# Patient Record
Sex: Male | Born: 1965 | Race: White | Hispanic: No | Marital: Married | Smoking: Former smoker
Health system: Southern US, Community
[De-identification: ages and names within clinical notes are randomized; demographics above are authoritative.]

## PROBLEM LIST (undated history)

## (undated) DIAGNOSIS — I10 Essential (primary) hypertension: Secondary | ICD-10-CM

## (undated) DIAGNOSIS — M2550 Pain in unspecified joint: Secondary | ICD-10-CM

## (undated) DIAGNOSIS — E119 Type 2 diabetes mellitus without complications: Secondary | ICD-10-CM

## (undated) DIAGNOSIS — K5792 Diverticulitis of intestine, part unspecified, without perforation or abscess without bleeding: Secondary | ICD-10-CM

## (undated) DIAGNOSIS — E785 Hyperlipidemia, unspecified: Secondary | ICD-10-CM

## (undated) DIAGNOSIS — G4730 Sleep apnea, unspecified: Secondary | ICD-10-CM

## (undated) HISTORY — DX: Type 2 diabetes mellitus without complications: E11.9

## (undated) HISTORY — DX: Sleep apnea, unspecified: G47.30

## (undated) HISTORY — DX: Essential (primary) hypertension: I10

## (undated) HISTORY — DX: Pain in unspecified joint: M25.50

## (undated) HISTORY — DX: Diverticulitis of intestine, part unspecified, without perforation or abscess without bleeding: K57.92

## (undated) HISTORY — DX: Hyperlipidemia, unspecified: E78.5

---

## 2001-02-09 ENCOUNTER — Emergency Department (HOSPITAL_COMMUNITY): Admission: EM | Admit: 2001-02-09 | Discharge: 2001-02-09 | Payer: Self-pay | Admitting: Emergency Medicine

## 2004-03-07 ENCOUNTER — Other Ambulatory Visit: Payer: Self-pay

## 2005-08-19 ENCOUNTER — Emergency Department: Payer: Self-pay | Admitting: Emergency Medicine

## 2006-06-09 ENCOUNTER — Emergency Department: Payer: Self-pay | Admitting: Emergency Medicine

## 2006-06-09 ENCOUNTER — Other Ambulatory Visit: Payer: Self-pay

## 2007-01-27 ENCOUNTER — Other Ambulatory Visit: Payer: Self-pay

## 2007-01-30 ENCOUNTER — Ambulatory Visit: Payer: Self-pay | Admitting: General Surgery

## 2007-01-30 HISTORY — PX: HERNIA REPAIR: SHX51

## 2007-03-03 ENCOUNTER — Emergency Department: Payer: Self-pay | Admitting: Emergency Medicine

## 2008-10-05 ENCOUNTER — Ambulatory Visit: Payer: Self-pay | Admitting: Internal Medicine

## 2009-07-25 ENCOUNTER — Ambulatory Visit: Payer: Self-pay | Admitting: Internal Medicine

## 2011-09-28 ENCOUNTER — Emergency Department: Payer: Self-pay | Admitting: Emergency Medicine

## 2014-05-02 ENCOUNTER — Encounter: Payer: Self-pay | Admitting: *Deleted

## 2014-05-25 ENCOUNTER — Ambulatory Visit: Payer: Self-pay | Admitting: General Surgery

## 2014-06-06 ENCOUNTER — Encounter: Payer: Self-pay | Admitting: General Surgery

## 2014-06-08 ENCOUNTER — Ambulatory Visit (INDEPENDENT_AMBULATORY_CARE_PROVIDER_SITE_OTHER): Payer: 59 | Admitting: General Surgery

## 2014-06-08 ENCOUNTER — Encounter: Payer: Self-pay | Admitting: General Surgery

## 2014-06-08 VITALS — BP 120/78 | HR 80 | Resp 16 | Ht 70.0 in | Wt 314.0 lb

## 2014-06-08 DIAGNOSIS — K429 Umbilical hernia without obstruction or gangrene: Secondary | ICD-10-CM

## 2014-06-08 NOTE — Progress Notes (Signed)
Patient ID: Edward Gutierrez, male   DOB: July 17, 1966, 48 y.o.   MRN: [id number]  Chief Complaint  Patient presents with  . Other    Evaluation of umbilical hernia    HPI Edward Gutierrez is a 48 y.o. male.  Here today for evaluation of an umbilical hernia.Patient states he noticed this about two years ago and in the last six months the area has been bothering him. He states it is an inch below the last hernia site. He states he had an epigastric hernia repair back in 2008. HPI  Past Medical History  Diagnosis Date  . Diabetes mellitus without complication   . Hypertension   . Sleep apnea     c pap  . Joint pain   . Hyperlipidemia     Past Surgical History  Procedure Laterality Date  . Hernia repair  01/30/2007    Epigastric hernia repaired with small Kugwl patch    Family History  Problem Relation Age of Onset  . Cancer Mother     lung  . Cancer Sister     breast    Social History History  Substance Use Topics  . Smoking status: Former Smoker    Quit date: 12/30/2008  . Smokeless tobacco: Never Used  . Alcohol Use: No    No Known Allergies  Current Outpatient Prescriptions  Medication Sig Dispense Refill  . benazepril (LOTENSIN) 40 MG tablet Take 1 tablet by mouth daily.      . fenofibrate micronized (LOFIBRA) 134 MG capsule Take 1 capsule by mouth daily.      Marland Kitchen glimepiride (AMARYL) 4 MG tablet Take 1 tablet by mouth 2 (two) times daily.      . hydrochlorothiazide (HYDRODIURIL) 25 MG tablet Take 1 tablet by mouth daily.      . INVOKANA 300 MG TABS Take 1 tablet by mouth daily.      Marland Kitchen JANUMET 50-1000 MG per tablet Take 1 tablet by mouth 2 (two) times daily.       No current facility-administered medications for this visit.    Review of Systems Review of Systems  Constitutional: Negative.   Respiratory: Negative.   Cardiovascular: Negative.     Blood pressure 120/78, pulse 80, resp. rate 16, height 5\' 10"  (1.778 m), weight 314 lb (142.429  kg).  Physical Exam Physical Exam  Constitutional: He is oriented to person, place, and time. He appears well-developed and well-nourished.  Eyes: Conjunctivae are normal. No scleral icterus.  Neck: Neck supple.  Cardiovascular: Normal rate, regular rhythm and normal heart sounds.   Pulmonary/Chest: Effort normal and breath sounds normal.  Abdominal: Soft. Normal appearance and bowel sounds are normal. A hernia ( non  reducible small umbilical herina) is present.    Neurological: He is alert and oriented to person, place, and time.  Skin: Skin is warm and dry.    Data Reviewed PCP notes of 05/02/2014 were reviewed.  The patient has lost 20 pounds and does continue to make use of a CPAP unit with good results. Comprehensive metabolic panel dated 93/23/5573 was notable for a blood sugar 176. Normal electrolytes. CBC showed a hemoglobin of 13.6 with an MCV of 86. Normal platelet count.  Assessment    Symptomatic umbilical hernia.    Plan    As the epigastric hernia site remains intact, we'll plan for an open umbilical hernia repair at a convenient time. If the patient does not require strenuous lifting at work he will be a will return to work approximately  2 weeks postprocedure.    Patient is scheduled for surgery at [facility] on 06/27/14. He will pre admit by phone on 06/17/14. Patient is aware to bring his C-Pap machine to the hospital the day of his surgery. Patient is aware of date and instructions.   Ref: Dr Lisette Grinder  Robert Bellow 06/09/2014, 6:49 AM

## 2014-06-08 NOTE — Patient Instructions (Addendum)
Hernia Repair with Laparoscope A hernia occurs when an internal organ pushes out through a weak spot in the belly (abdominal) wall muscles. Hernias most commonly occur in the groin and around the navel. Hernias can also occur through a cut by the surgeon (incision) after an abdominal operation. A hernia may be caused by:  Lifting heavy objects.  Prolonged coughing.  Straining to move your bowels. Hernias can often be pushed back into place (reduced). Most hernias tend to get worse over time. Problems occur when abdominal contents get stuck in the opening and the blood supply is blocked or impaired (incarcerated hernia). Because of these risks, you require surgery to repair the hernia. Your hernia will be repaired using a laparoscope. Laparoscopic surgery is a type of minimally invasive surgery. It does not involve making a typical surgical cut (incision) in the skin. A laparoscope is a telescope-like rod and lens system. It is usually connected to a video camera and a light source so your caregiver can clearly see the operative area. The instruments are inserted through  to  inch (5 mm or 10 mm) openings in the skin at specific locations. A working and viewing space is created by blowing a small amount of carbon dioxide gas into the abdominal cavity. The abdomen is essentially blown up like a balloon (insufflated). This elevates the abdominal wall above the internal organs like a dome. The carbon dioxide gas is common to the human body and can be absorbed by tissue and removed by the respiratory system. Once the repair is completed, the small incisions will be closed with either stitches (sutures) or staples (just like a paper stapler only this staple holds the skin together). LET YOUR CAREGIVERS KNOW ABOUT:  Allergies.  Medications taken including herbs, eye drops, over the counter medications, and creams.  Use of steroids (by mouth or creams).  Previous problems with anesthetics or  Novocaine.  Possibility of pregnancy, if this applies.  History of blood clots (thrombophlebitis).  History of bleeding or blood problems.  Previous surgery.  Other health problems. BEFORE THE PROCEDURE  Laparoscopy can be done either in a hospital or out-patient clinic. You may be given a mild sedative to help you relax before the procedure. Once in the operating room, you will be given a general anesthesia to make you sleep (unless you and your caregiver choose a different anesthetic).  AFTER THE PROCEDURE  After the procedure you will be watched in a recovery area. Depending on what type of hernia was repaired, you might be admitted to the hospital or you might go home the same day. With this procedure you may have less pain and scarring. This usually results in a quicker recovery and less risk of infection. HOME CARE INSTRUCTIONS   Bed rest is not required. You may continue your normal activities but avoid heavy lifting (more than 10 pounds) or straining.  Cough gently. If you are a smoker it is best to stop, as even the best hernia repair can break down with the continual strain of coughing.  Avoid driving until given the OK by your surgeon.  There are no dietary restrictions unless given otherwise.  TAKE ALL MEDICATIONS AS DIRECTED.  Only take over-the-counter or prescription medicines for pain, discomfort, or fever as directed by your caregiver. SEEK MEDICAL CARE IF:   There is increasing abdominal pain or pain in your incisions.  There is more bleeding from incisions, other than minimal spotting.  You feel light headed or faint.  You   develop an unexplained fever, chills, and/or an oral temperature above 102 F (38.9 C).  You have redness, swelling, or increasing pain in the wound.  Pus coming from wound.  A foul smell coming from the wound or dressings. SEEK IMMEDIATE MEDICAL CARE IF:   You develop a rash.  You have difficulty breathing.  You have any  allergic problems. MAKE SURE YOU:   Understand these instructions.  Will watch your condition.  Will get help right away if you are not doing well or get worse. Document Released: 12/16/2005 Document Revised: 03/09/2012 Document Reviewed: 11/15/2009 [facility] Patient Information 2014 [place], [place].  Patient is scheduled for surgery at [facility] on 06/27/14. He will pre admit by phone on 06/17/14. Patient is aware to bring his C-Pap machine to the hospital the day of his surgery. Patient is aware of date and instructions.

## 2014-06-09 ENCOUNTER — Encounter: Payer: Self-pay | Admitting: General Surgery

## 2014-06-09 ENCOUNTER — Other Ambulatory Visit: Payer: Self-pay | Admitting: General Surgery

## 2014-06-09 DIAGNOSIS — K429 Umbilical hernia without obstruction or gangrene: Secondary | ICD-10-CM | POA: Insufficient documentation

## 2014-06-21 ENCOUNTER — Ambulatory Visit: Payer: Self-pay | Admitting: Anesthesiology

## 2014-06-21 DIAGNOSIS — I10 Essential (primary) hypertension: Secondary | ICD-10-CM

## 2014-06-21 DIAGNOSIS — Z01810 Encounter for preprocedural cardiovascular examination: Secondary | ICD-10-CM

## 2014-06-21 LAB — POTASSIUM: Potassium: 4.2 mmol/L (ref 3.5–5.1)

## 2014-06-27 ENCOUNTER — Ambulatory Visit: Payer: Self-pay | Admitting: General Surgery

## 2014-06-27 DIAGNOSIS — K429 Umbilical hernia without obstruction or gangrene: Secondary | ICD-10-CM

## 2014-06-28 ENCOUNTER — Encounter: Payer: Self-pay | Admitting: General Surgery

## 2014-06-28 HISTORY — PX: HERNIA REPAIR: SHX51

## 2014-07-06 ENCOUNTER — Ambulatory Visit (INDEPENDENT_AMBULATORY_CARE_PROVIDER_SITE_OTHER): Payer: Self-pay | Admitting: General Surgery

## 2014-07-06 ENCOUNTER — Encounter: Payer: Self-pay | Admitting: General Surgery

## 2014-07-06 VITALS — BP 140/76 | HR 76 | Resp 14 | Ht 70.0 in | Wt 315.0 lb

## 2014-07-06 DIAGNOSIS — K429 Umbilical hernia without obstruction or gangrene: Secondary | ICD-10-CM | POA: Insufficient documentation

## 2014-07-06 NOTE — Patient Instructions (Signed)
No heavy lifting over 20 pounds for one month.

## 2014-07-06 NOTE — Progress Notes (Signed)
Patient ID: MARJORIE LUSSIER, male   DOB: 10/11/1966, 48 y.o.   MRN: [id number]  Chief Complaint  Patient presents with  . Routine Post Op    umbilical hernia    HPI RICAHRD SCHWAGER is a 48 y.o. male here today for his post op umbilical hernia repair done on 06/27/14.Patient states he is doing well. No difficulty with bowel or bladder function. HPI  Past Medical History  Diagnosis Date  . Diabetes mellitus without complication   . Hypertension   . Sleep apnea     c pap  . Joint pain   . Hyperlipidemia     Past Surgical History  Procedure Laterality Date  . Hernia repair  01/30/2007    Epigastric hernia repaired with small Kugwl patch  . Hernia repair  03/27/50    umbilical hernia    Family History  Problem Relation Age of Onset  . Cancer Mother     lung  . Cancer Sister     breast    Social History History  Substance Use Topics  . Smoking status: Former Smoker    Quit date: 12/30/2008  . Smokeless tobacco: Never Used  . Alcohol Use: No    Allergies  Allergen Reactions  . Codeine Nausea And Vomiting    Current Outpatient Prescriptions  Medication Sig Dispense Refill  . benazepril (LOTENSIN) 40 MG tablet Take 1 tablet by mouth daily.      . fenofibrate micronized (LOFIBRA) 134 MG capsule Take 1 capsule by mouth daily.      Marland Kitchen glimepiride (AMARYL) 4 MG tablet Take 1 tablet by mouth 2 (two) times daily.      . hydrochlorothiazide (HYDRODIURIL) 25 MG tablet Take 1 tablet by mouth daily.      . INVOKANA 300 MG TABS Take 1 tablet by mouth daily.      Marland Kitchen JANUMET 50-1000 MG per tablet Take 1 tablet by mouth 2 (two) times daily.       No current facility-administered medications for this visit.    Review of Systems Review of Systems  Constitutional: Negative.   Respiratory: Negative.   Cardiovascular: Negative.     Blood pressure 140/76, pulse 76, resp. rate 14, height 5\' 10"  (1.778 m), weight 315 lb (142.883 kg).  Physical Exam Physical Exam   Constitutional: He is oriented to person, place, and time. He appears well-developed and well-nourished.  Abdominal: Soft. Normal appearance.  1 cm scab upper incision  Neurological: He is alert and oriented to person, place, and time.  Skin: Skin is warm and dry.    Data Reviewed Primary repair was completed.  Assessment    Doing well status post umbilical hernia repair.     Plan    The patient will make use of Neosporin daily to the area of superficial epithelial slough on the superior edge of the incision.  Activity restrictions were reviewed. The patient will keep his lifting over 20 pounds for the next couple of weeks. Walking and exercise bike utilization was encouraged.  Follow up will be on an as-needed basis.     PCP: Al Pimple 07/06/2014, 9:17 PM

## 2014-10-26 DIAGNOSIS — S161XXA Strain of muscle, fascia and tendon at neck level, initial encounter: Secondary | ICD-10-CM | POA: Insufficient documentation

## 2014-11-07 DIAGNOSIS — E119 Type 2 diabetes mellitus without complications: Secondary | ICD-10-CM | POA: Insufficient documentation

## 2014-11-07 DIAGNOSIS — I10 Essential (primary) hypertension: Secondary | ICD-10-CM | POA: Insufficient documentation

## 2014-11-07 DIAGNOSIS — E785 Hyperlipidemia, unspecified: Secondary | ICD-10-CM | POA: Insufficient documentation

## 2014-11-07 DIAGNOSIS — G4733 Obstructive sleep apnea (adult) (pediatric): Secondary | ICD-10-CM | POA: Insufficient documentation

## 2015-04-22 NOTE — Op Note (Signed)
PATIENT NAME:  Edward Gutierrez, Edward Gutierrez MR#:  [id number] DATE OF BIRTH:  1966/08/04  DATE OF PROCEDURE:  06/27/2014  PREOPERATIVE DIAGNOSIS: Umbilical hernia with incarcerated omentum.   POSTOPERATIVE DIAGNOSIS: Umbilical hernia with incarcerated omentum.   OPERATIVE PROCEDURE: Repair of umbilical hernia.   SURGEON: Hervey Ard, MD   ANESTHESIA: General endotracheal under Dr. [place] Service, Marcaine 0.5% plain, 30 mL local infiltration, Toradol 30 mg.   ESTIMATED BLOOD LOSS: 50 mL.   CLINICAL NOTE: This 49 year old male had previously undergone repair of an epigastric hernia 8 years ago. He has developed a symptomatic umbilical hernia. He is admitted for elective repair. He received Kefzol prior to the procedure.   OPERATIVE NOTE: The patient underwent general endotracheal anesthesia without difficulty. The anesthesia staff reported he did require high tidal volumes to maintain adequate saturation. The abdomen had previously been clipped of hair. ChloraPrep was applied to the skin. Marcaine was infiltrated for postoperative analgesia. An infraumbilical incision was made and carried down through the skin and subcutaneous tissue, and the umbilical skin dissected free from the sac. The sac was freed circumferentially and transected at the fascial level, excised and discarded. There was redundant omentum within the hernia defect that was resected with hemostasis with 2-0 Vicryl ties. There was a troublesome bleeding point and it turned out to be from the undersurface of the fascia. This necessitated extending the 1 cm fascial opening a centimeter in either direction to allow exposure. Once this was controlled the field was dry and there was no evidence of bleeding from the transected omentum. The defect was still fairly small and it was elected to complete a primary repair with interrupted 0 Surgilon sutures. After this was completed, Toradol was infiltrated into the wound. The umbilical skin was tacked  to the fascia with 3-0 Vicryl. The adipose layer was approximated with a running 3-0 Vicryl, and the skin closed with running 4-0 Vicryl subcuticular suture. Benzoin and Steri-Strips followed by Telfa and Tegaderm dressing was applied. The patient tolerated the procedure well and was taken to the recovery room in stable condition.  ____________________________ Robert Bellow, MD jwb:sb D: 06/28/2014 08:06:51 ET T: 06/28/2014 08:26:02 ET JOB#: [id number]  cc: Robert Bellow, MD, <Dictator> Edward B. Sarina Ser, MD JEFFREY Amedeo Kinsman MD ELECTRONICALLY SIGNED 06/28/2014 18:49

## 2016-06-05 DIAGNOSIS — S86919A Strain of unspecified muscle(s) and tendon(s) at lower leg level, unspecified leg, initial encounter: Secondary | ICD-10-CM | POA: Insufficient documentation

## 2016-12-30 DIAGNOSIS — K5792 Diverticulitis of intestine, part unspecified, without perforation or abscess without bleeding: Secondary | ICD-10-CM

## 2016-12-30 HISTORY — DX: Diverticulitis of intestine, part unspecified, without perforation or abscess without bleeding: K57.92

## 2017-08-13 ENCOUNTER — Emergency Department: Payer: PRIVATE HEALTH INSURANCE

## 2017-08-13 ENCOUNTER — Emergency Department
Admission: EM | Admit: 2017-08-13 | Discharge: 2017-08-13 | Disposition: A | Discharge status: Home / Self Care | Payer: PRIVATE HEALTH INSURANCE | Attending: Emergency Medicine | Admitting: Emergency Medicine

## 2017-08-13 DIAGNOSIS — Z87891 Personal history of nicotine dependence: Secondary | ICD-10-CM | POA: Diagnosis not present

## 2017-08-13 DIAGNOSIS — I10 Essential (primary) hypertension: Secondary | ICD-10-CM | POA: Insufficient documentation

## 2017-08-13 DIAGNOSIS — Z79899 Other long term (current) drug therapy: Secondary | ICD-10-CM | POA: Insufficient documentation

## 2017-08-13 DIAGNOSIS — R1032 Left lower quadrant pain: Secondary | ICD-10-CM | POA: Diagnosis present

## 2017-08-13 DIAGNOSIS — K5732 Diverticulitis of large intestine without perforation or abscess without bleeding: Secondary | ICD-10-CM | POA: Insufficient documentation

## 2017-08-13 DIAGNOSIS — E119 Type 2 diabetes mellitus without complications: Secondary | ICD-10-CM | POA: Insufficient documentation

## 2017-08-13 DIAGNOSIS — Z7984 Long term (current) use of oral hypoglycemic drugs: Secondary | ICD-10-CM | POA: Diagnosis not present

## 2017-08-13 DIAGNOSIS — K5792 Diverticulitis of intestine, part unspecified, without perforation or abscess without bleeding: Secondary | ICD-10-CM

## 2017-08-13 LAB — CBC WITH DIFFERENTIAL/PLATELET
BASOS ABS: 0.1 10*3/uL (ref 0–0.1)
BASOS PCT: 0 %
EOS ABS: 0.4 10*3/uL (ref 0–0.7)
EOS PCT: 2 %
HCT: 48.6 % (ref 40.0–52.0)
Hemoglobin: 16.2 g/dL (ref 13.0–18.0)
Lymphocytes Relative: 7 %
Lymphs Abs: 1.2 10*3/uL (ref 1.0–3.6)
MCH: 28.7 pg (ref 26.0–34.0)
MCHC: 33.3 g/dL (ref 32.0–36.0)
MCV: 86.2 fL (ref 80.0–100.0)
Monocytes Absolute: 1.4 10*3/uL — ABNORMAL HIGH (ref 0.2–1.0)
Monocytes Relative: 8 %
NEUTROS PCT: 83 %
Neutro Abs: 14.8 10*3/uL — ABNORMAL HIGH (ref 1.4–6.5)
PLATELETS: 365 10*3/uL (ref 150–440)
RBC: 5.64 MIL/uL (ref 4.40–5.90)
RDW: 14.4 % (ref 11.5–14.5)
WBC: 17.8 10*3/uL — AB (ref 3.8–10.6)

## 2017-08-13 LAB — COMPREHENSIVE METABOLIC PANEL
ALBUMIN: 4.2 g/dL (ref 3.5–5.0)
ALT: 25 U/L (ref 17–63)
AST: 25 U/L (ref 15–41)
Alkaline Phosphatase: 42 U/L (ref 38–126)
Anion gap: 9 (ref 5–15)
BUN: 21 mg/dL — ABNORMAL HIGH (ref 6–20)
CHLORIDE: 107 mmol/L (ref 101–111)
CO2: 25 mmol/L (ref 22–32)
CREATININE: 0.88 mg/dL (ref 0.61–1.24)
Calcium: 9.4 mg/dL (ref 8.9–10.3)
GFR calc non Af Amer: >60 mL/min (ref >60)
GLUCOSE: 122 mg/dL — AB (ref 65–99)
Potassium: 3.7 mmol/L (ref 3.5–5.1)
SODIUM: 141 mmol/L (ref 135–145)
Total Bilirubin: 0.6 mg/dL (ref 0.3–1.2)
Total Protein: 7.5 g/dL (ref 6.5–8.1)

## 2017-08-13 LAB — URINALYSIS, COMPLETE (UACMP) WITH MICROSCOPIC
Bacteria, UA: NONE SEEN
Bilirubin Urine: NEGATIVE
HGB URINE DIPSTICK: NEGATIVE
Ketones, ur: NEGATIVE mg/dL
Leukocytes, UA: NEGATIVE
NITRITE: NEGATIVE
PH: 6 (ref 5.0–8.0)
Protein, ur: 30 mg/dL — AB
SPECIFIC GRAVITY, URINE: 1.038 — AB (ref 1.005–1.030)

## 2017-08-13 LAB — LIPASE, BLOOD: Lipase: 49 U/L (ref 11–51)

## 2017-08-13 LAB — TROPONIN I

## 2017-08-13 MED ORDER — CIPROFLOXACIN HCL 500 MG PO TABS: 500.0000 mg | ORAL_TABLET | Freq: Two times a day (BID) | ORAL | 0 refills | Status: AC

## 2017-08-13 MED ORDER — IOPAMIDOL (ISOVUE-300) INJECTION 61%
125.0000 mL | Freq: Once | INTRAVENOUS | Status: AC | PRN
  Administered 2017-08-13: 125 mL via INTRAVENOUS

## 2017-08-13 MED ORDER — METRONIDAZOLE 500 MG PO TABS: 500.0000 mg | ORAL_TABLET | Freq: Three times a day (TID) | ORAL | 0 refills | Status: AC

## 2017-08-13 MED ORDER — ONDANSETRON HCL 4 MG/2ML IJ SOLN
4.0000 mg | Freq: Once | INTRAMUSCULAR | Status: AC
  Administered 2017-08-13: 4 mg via INTRAVENOUS
  Filled 2017-08-13: qty 2

## 2017-08-13 MED ORDER — METRONIDAZOLE 500 MG PO TABS
500.0000 mg | ORAL_TABLET | Freq: Once | ORAL | Status: AC
  Administered 2017-08-13: 500 mg via ORAL
  Filled 2017-08-13: qty 1

## 2017-08-13 MED ORDER — SODIUM CHLORIDE 0.9 % IV BOLUS (SEPSIS)
1000.0000 mL | Freq: Once | INTRAVENOUS | Status: AC
  Administered 2017-08-13: 1000 mL via INTRAVENOUS

## 2017-08-13 MED ORDER — OXYCODONE-ACETAMINOPHEN 5-325 MG PO TABS: 1.0000 | ORAL_TABLET | Freq: Four times a day (QID) | ORAL | 0 refills | Status: DC | PRN

## 2017-08-13 MED ORDER — CIPROFLOXACIN HCL 500 MG PO TABS
500.0000 mg | ORAL_TABLET | Freq: Once | ORAL | Status: AC
  Administered 2017-08-13: 500 mg via ORAL
  Filled 2017-08-13: qty 1

## 2017-08-13 MED ORDER — OXYCODONE-ACETAMINOPHEN 5-325 MG PO TABS
1.0000 | ORAL_TABLET | Freq: Once | ORAL | Status: AC
  Administered 2017-08-13: 1 via ORAL
  Filled 2017-08-13: qty 1

## 2017-08-13 MED ORDER — IOPAMIDOL (ISOVUE-300) INJECTION 61%
30.0000 mL | Freq: Once | INTRAVENOUS | Status: AC | PRN
  Administered 2017-08-13: 30 mL via ORAL

## 2017-08-13 NOTE — ED Provider Notes (Addendum)
[facility] Emergency Department Provider Note  ____________________________________________   First MD Initiated Contact with Patient 08/13/17 2016     (approximate)  I have reviewed the triage vital signs and the nursing notes.   HISTORY  Chief Complaint Abdominal Pain   HPI Edward Gutierrez is a 51 y.o. male with a history of diabetes, hypertension and hyperlipidemia who is presenting with left lower quadrant abdominal pain over the past 3 days. He says the pain has been worsening and tonight when he got out of his truck hit extreme left lower quadrant pain which made him feel like he was going to pass out. He was experiencing nausea at the time as well but did not vomit. Patient is also not had a bowel movement in 3 days and says that he usually has 2-3 per day. Describes his pain is mild to moderate right now and focused to the left lower quadrant. It is no longer feeling lightheaded. Denies any chest pain or shortness of breath. Says the pain is radiating through to his back. Offered pain medicine but does not want at this time.   Past Medical History:  Diagnosis Date  . Diabetes mellitus without complication ([place])   . Hyperlipidemia   . Hypertension   . Joint pain   . Sleep apnea    c pap    Patient Active Problem List   Diagnosis Date Noted  . Umbilical hernia without obstruction and without gangrene 07/06/2014  . Umbilical hernia 63/33/5456    Past Surgical History:  Procedure Laterality Date  . HERNIA REPAIR  01/30/2007   Epigastric hernia repaired with small Kugwl patch  . HERNIA REPAIR  2/56/38   umbilical hernia    Prior to Admission medications   Medication Sig Start Date End Date Taking? Authorizing Provider  benazepril (LOTENSIN) 40 MG tablet Take 1 tablet by mouth daily. 04/27/14   [provider]  fenofibrate micronized (LOFIBRA) 134 MG capsule Take 1 capsule by mouth daily. 04/19/14   [provider]    glimepiride (AMARYL) 4 MG tablet Take 1 tablet by mouth 2 (two) times daily. 04/27/14   [provider]  hydrochlorothiazide (HYDRODIURIL) 25 MG tablet Take 1 tablet by mouth daily. 04/27/14   [provider]  INVOKANA 300 MG TABS Take 1 tablet by mouth daily. 05/01/14   [provider]  JANUMET 50-1000 MG per tablet Take 1 tablet by mouth 2 (two) times daily. 05/08/14   [provider]    Allergies Codeine  Family History  Problem Relation Age of Onset  . Cancer Mother        lung  . Cancer Sister        breast    Social History Social History  Substance Use Topics  . Smoking status: Former Smoker    Quit date: 12/30/2008  . Smokeless tobacco: Never Used  . Alcohol use No    Review of Systems  Constitutional: No fever/chills Eyes: No visual changes. ENT: No sore throat. Cardiovascular: Denies chest pain. Respiratory: Denies shortness of breath. Gastrointestinal: No diarrhea.  Genitourinary: Negative for dysuria. Musculoskeletal: Negative for back pain. Skin: Negative for rash. Neurological: Negative for headaches, focal weakness or numbness.   ____________________________________________   PHYSICAL EXAM:  VITAL SIGNS: ED Triage Vitals  Enc Vitals Group     BP 08/13/17 2018 137/79     Pulse Rate 08/13/17 2018 74     Resp 08/13/17 2018 18     Temp 08/13/17  2018 98.3 F (36.8 C)     Temp Source 08/13/17 2018 Oral     SpO2 08/13/17 2018 96 %     Weight 08/13/17 2018 295 lb (133.8 kg)     Height 08/13/17 2018 5\' 10"  (1.778 m)     Head Circumference --      Peak Flow --      Pain Score 08/13/17 2017 10     Pain Loc --      Pain Edu? --      Excl. in [place]? --     Constitutional: Alert and oriented. Well appearing and in no acute distress. Eyes: Conjunctivae are normal.  Head: Atraumatic. Nose: No congestion/rhinnorhea. Mouth/Throat: Mucous membranes are moist.  Neck: No stridor.   Cardiovascular: Normal rate, regular  rhythm. Grossly normal heart sounds.  Respiratory: Normal respiratory effort.  No retractions. Lungs CTAB. Gastrointestinal: Soft With moderate tenderness to palpation to the left lower quadrant without any rebound or guarding. No distention. Digital rectal exam without fecal impaction. Grossly brown stool on the glove. Musculoskeletal: No lower extremity tenderness nor edema.  No joint effusions. Neurologic:  Normal speech and language. No gross focal neurologic deficits are appreciated. Skin:  Skin is warm, dry and intact. No rash noted. Psychiatric: Mood and affect are normal. Speech and behavior are normal.  ____________________________________________   LABS (all labs ordered are listed, but only abnormal results are displayed)  Labs Reviewed  CBC WITH DIFFERENTIAL/PLATELET - Abnormal; Notable for the following:       Result Value   WBC 17.8 (*)    Neutro Abs 14.8 (*)    Monocytes Absolute 1.4 (*)    All other components within normal limits  COMPREHENSIVE METABOLIC PANEL - Abnormal; Notable for the following:    Glucose, Bld 122 (*)    BUN 21 (*)    All other components within normal limits  URINALYSIS, COMPLETE (UACMP) WITH MICROSCOPIC - Abnormal; Notable for the following:    Color, Urine YELLOW (*)    APPearance CLEAR (*)    Specific Gravity, Urine 1.038 (*)    Glucose, UA >=500 (*)    Protein, ur 30 (*)    Squamous Epithelial / LPF 0-5 (*)    All other components within normal limits  LIPASE, BLOOD  TROPONIN I   ____________________________________________  EKG  ED ECG REPORT I, Doran Stabler, the attending physician, personally viewed and interpreted this ECG.   Date: 08/13/2017  EKG Time: 2017  Rate: 75  Rhythm: normal sinus rhythm  Axis: Normal  Intervals:none  ST&T Change: No ST segment elevation or depression. No abnormal T-wave inversion. EKG machine reads as ST elevation. This is possibly because of the wandering baseline in lead  V3.  ____________________________________________  RADIOLOGY  Acute diverticulitis. No evidence of perforation or abscess. ____________________________________________   PROCEDURES  Procedure(s) performed:   Procedures  Critical Care performed:   ____________________________________________   INITIAL IMPRESSION / ASSESSMENT AND PLAN / ED COURSE  Pertinent labs & imaging results that were available during my care of the patient were reviewed by me and considered in my medical decision making (see chart for details).  ----------------------------------------- 10:34 PM on 08/13/2017 -----------------------------------------  Patient with CT scan showing acute diverticulitis. I discussed this with the patient. Because of his high white blood cell count and tenderness I offered him admission to the hospital. However, he would prefer to be discharged to home with by mouth medications. He'll be discharged with Cipro and Flagyl. He  knows that he must follow-up with his primary care doctor for further evaluation and possible colonoscopy to rule out underlying mass. We also discussed strict return precautions come back for any worsening or concerning symptoms were especially forcing abdominal pain, nausea vomiting or fever. The patient as well as family understanding and will comply. Near syncopal episode prior to arrival from pain most likely. Reassuring cardiac workup.      ____________________________________________   FINAL CLINICAL IMPRESSION(S) / ED DIAGNOSES  Acute diverticulitis.    NEW MEDICATIONS STARTED DURING THIS VISIT:  New Prescriptions   No medications on file     Note:  This document was prepared using Dragon voice recognition software and may include unintentional dictation errors.     Orbie Pyo, MD 08/13/17 2235  At the time of discharge the patient began experiencing increased pain to his left lower quadrant. I palpated and there he  was so tender but soft. He was given a Percocet and he says the pain is dissipated. He was able to sleep for short period of time. He says he is ready to be discharged home at this time. He was given Cipro and Flagyl. He says he does not take a sulfonylurea at this time for his diabetes.   Orbie Pyo, MD 08/13/17 [phone]

## 2017-08-13 NOTE — ED Triage Notes (Addendum)
Pt comes via [facility] with c/o LLQ abdominal pain. Per EMS VS stable, EKG NSR, BS-125. Pt is A&OX4. Pt states pain began earlier this afternoon and that he has been constipated for about 3 days now. Pt states he did take Milk of Mag with no relief. Pt denies vomiting, but has experienced some nausea. Respirations even and unlabored.

## 2017-08-13 NOTE — ED Notes (Signed)
Pt up to toilet with no difficulty

## 2018-05-08 ENCOUNTER — Encounter: Payer: Self-pay | Admitting: *Deleted

## 2018-06-11 ENCOUNTER — Ambulatory Visit: Payer: PRIVATE HEALTH INSURANCE | Admitting: General Surgery

## 2018-06-11 ENCOUNTER — Encounter: Payer: Self-pay | Admitting: General Surgery

## 2018-06-11 VITALS — BP 134/70 | HR 89 | Resp 14 | Ht 70.0 in | Wt 297.0 lb

## 2018-06-11 DIAGNOSIS — Z1211 Encounter for screening for malignant neoplasm of colon: Secondary | ICD-10-CM

## 2018-06-11 MED ORDER — POLYETHYLENE GLYCOL 3350 17 GM/SCOOP PO POWD: 1.0000 | Freq: Once | ORAL | 0 refills | Status: AC

## 2018-06-11 NOTE — Progress Notes (Signed)
The patient reports being seen in the emergency department patient ID: Edward Gutierrez, male   DOB: [birth year], 52 y.o.   MRN: [id number]  Chief Complaint  Patient presents with  . Colonoscopy    HPI Edward Gutierrez is a 52 y.o. male.  Who presents for a colonoscopy discussion, none prior. Denies any gastrointestinal issues. Bowels move irregular (normal for him) based on what he eats and no bleeding noted.  In August 2018 with an episode of diverticulitis.  Treated as an outpatient with oral antibiotics.  No recurrent symptoms.  He works in Engineer, technical sales.  HPI  Past Medical History:  Diagnosis Date  . Diabetes mellitus without complication ([place])   . Diverticulitis 2018  . Hyperlipidemia   . Hypertension   . Joint pain   . Sleep apnea    c pap    Past Surgical History:  Procedure Laterality Date  . HERNIA REPAIR  01/30/2007   Epigastric hernia repaired with small Kugwl patch  . HERNIA REPAIR  6/96/78   umbilical hernia    Family History  Problem Relation Age of Onset  . Cancer Mother        lung  . Cancer Sister        breast    Social History Social History   Tobacco Use  . Smoking status: Former Smoker    Last attempt to quit: 12/30/2008    Years since quitting: 9.4  . Smokeless tobacco: Never Used  Substance Use Topics  . Alcohol use: Yes    Comment: occasional  . Drug use: No    Allergies  Allergen Reactions  . Codeine Nausea And Vomiting    Current Outpatient Medications  Medication Sig Dispense Refill  . benazepril (LOTENSIN) 40 MG tablet Take 1 tablet by mouth daily.    . Choline Fenofibrate (FENOFIBRIC ACID) 135 MG CPDR daily.     . Exenatide ER (BYDUREON) 2 MG PEN Inject into the skin once a week.    Marland Kitchen JANUMET 50-1000 MG per tablet Take 1 tablet by mouth 2 (two) times daily.    . Omega-3 1000 MG CAPS Take by mouth daily.    Nelva Nay SOLOSTAR 300 UNIT/ML SOPN 30 Units daily.      No current facility-administered medications for this visit.      Review of Systems Review of Systems  Constitutional: Negative.   Respiratory: Negative.   Cardiovascular: Negative.   Gastrointestinal: Negative for constipation, diarrhea and nausea.    Blood pressure 134/70, pulse 89, resp. rate 14, height 5\' 10"  (1.778 m), weight 297 lb (134.7 kg), SpO2 98 %.  Physical Exam Physical Exam  Constitutional: He is oriented to person, place, and time. He appears well-developed and well-nourished.  HENT:  Mouth/Throat: Oropharynx is clear and moist. No oropharyngeal exudate.  Eyes: Conjunctivae are normal. No scleral icterus.  Neck: Neck supple.  Cardiovascular: Normal rate, regular rhythm and normal heart sounds.  Pulmonary/Chest: Effort normal and breath sounds normal.  Abdominal: Soft.    Lymphadenopathy:    He has no cervical adenopathy.  Neurological: He is alert and oriented to person, place, and time.  Skin: Skin is warm and dry.  Psychiatric: His behavior is normal.    Data Reviewed Comprehensive metabolic panel of August 13, 2017 showed a creatinine of 0.88 with an estimated GFR greater than 60. CBC of the same date showed a hemoglobin of 16.2 with an MCV of 86, white blood cell count of 17,800 with left shift.  Platelet  count of 365,000.  ED diagnosis of diverticulitis.  CT scan of August 13, 2017:   IMPRESSION: 1. Acute diverticulitis noted at the proximal sigmoid colon, with associated focal wall thickening, soft tissue inflammation and inflamed diverticulum. No evidence of perforation or abscess formation at this time. 2. Underlying colonic mass cannot be excluded, given the appearance of the wall thickening. Colonoscopy would be helpful for further evaluation, after completion of treatment for diverticulitis. 3. Scattered diverticulosis along the descending and proximal sigmoid colon. 4. Scattered aortic atherosclerosis.  This imaging study was personally reviewed.  The patient has moderate diverticulosis in the sigmoid  colon and focal, intense inflammatory changes in the colon at the junction of the descending sigmoid.  Assessment    Candidate for screening colonoscopy.    Plan    Colonoscopy with possible biopsy/polypectomy prn: Information regarding the procedure, including its potential risks and complications (including but not limited to perforation of the bowel, which may require emergency surgery to repair, and bleeding) was verbally given to the patient. Educational information regarding lower intestinal endoscopy was given to the patient. Written instructions for how to complete the bowel prep using Miralax were provided. The importance of drinking ample fluids to avoid dehydration as a result of the prep emphasized.     HPI, Physical Exam, Assessment and Plan have been scribed under the direction and in the presence of Robert Bellow, MD. Karie Fetch, RN  I have completed the exam and reviewed the above documentation for accuracy and completeness.  I agree with the above.  Haematologist has been used and any errors in dictation or transcription are unintentional.  Hervey Ard, M.D., F.A.C.S.  The patient is scheduled for a Colonoscopy at [facility] on 06/17/18. They are aware to call the day before to get their arrival time. He will stop his Fish Oil one week prior. He will decrease his Toujeo to 1/2 dose and his Janumet to 1/2 tab once daily the day of prep and procedure. He will not need to adjust his Bydureon. He is aware to bring his C Pap machine with him to the hospital the day of. Miralax prescription has been sent into the patient's pharmacy. The patient is aware of date and instructions.  Documented by Lesly Rubenstein LPN   Karie Fetch M 06/11/2018, 3:57 PM

## 2018-06-11 NOTE — Patient Instructions (Addendum)
Colonoscopy, Adult A colonoscopy is an exam to look at the entire large intestine. During the exam, a lubricated, bendable tube is inserted into the anus and then passed into the rectum, colon, and other parts of the large intestine. A colonoscopy is often done as a part of normal colorectal screening or in response to certain symptoms, such as anemia, persistent diarrhea, abdominal pain, and blood in the stool. The exam can help screen for and diagnose medical problems, including:  Tumors.  Polyps.  Inflammation.  Areas of bleeding.  Tell a health care provider about:  Any allergies you have.  All medicines you are taking, including vitamins, herbs, eye drops, creams, and over-the-counter medicines.  Any problems you or family members have had with anesthetic medicines.  Any blood disorders you have.  Any surgeries you have had.  Any medical conditions you have.  Any problems you have had passing stool. What are the risks? Generally, this is a safe procedure. However, problems may occur, including:  Bleeding.  A tear in the intestine.  A reaction to medicines given during the exam.  Infection (rare).  What happens before the procedure? Eating and drinking restrictions Follow instructions from your health care provider about eating and drinking, which may include:  A few days before the procedure - follow a low-fiber diet. Avoid nuts, seeds, dried fruit, raw fruits, and vegetables.  1-3 days before the procedure - follow a clear liquid diet. Drink only clear liquids, such as clear broth or bouillon, black coffee or tea, clear juice, clear soft drinks or sports drinks, gelatin dessert, and popsicles. Avoid any liquids that contain red or purple dye.  On the day of the procedure - do not eat or drink anything during the 2 hours before the procedure, or within the time period that your health care provider recommends.  Bowel prep If you were prescribed an oral bowel prep  to clean out your colon:  Take it as told by your health care provider. Starting the day before your procedure, you will need to drink a large amount of medicated liquid. The liquid will cause you to have multiple loose stools until your stool is almost clear or light green.  If your skin or anus gets irritated from diarrhea, you may use these to relieve the irritation: ? Medicated wipes, such as adult wet wipes with aloe and vitamin E. ? A skin soothing-product like petroleum jelly.  If you vomit while drinking the bowel prep, take a break for up to 60 minutes and then begin the bowel prep again. If vomiting continues and you cannot take the bowel prep without vomiting, call your health care provider.  General instructions  Ask your health care provider about changing or stopping your regular medicines. This is especially important if you are taking diabetes medicines or blood thinners.  Plan to have someone take you home from the hospital or clinic. What happens during the procedure?  An IV tube may be inserted into one of your veins.  You will be given medicine to help you relax (sedative).  To reduce your risk of infection: ? Your health care team will wash or sanitize their hands. ? Your anal area will be washed with soap.  You will be asked to lie on your side with your knees bent.  Your health care provider will lubricate a long, thin, flexible tube. The tube will have a camera and a light on the end.  The tube will be inserted into your   anus.  The tube will be gently eased through your rectum and colon.  Air will be delivered into your colon to keep it open. You may feel some pressure or cramping.  The camera will be used to take images during the procedure.  A small tissue sample may be removed from your body to be examined under a microscope (biopsy). If any potential problems are found, the tissue will be sent to a lab for testing.  If small polyps are found, your  health care provider may remove them and have them checked for cancer cells.  The tube that was inserted into your anus will be slowly removed. The procedure may vary among health care providers and hospitals. What happens after the procedure?  Your blood pressure, heart rate, breathing rate, and blood oxygen level will be monitored until the medicines you were given have worn off.  Do not drive for 24 hours after the exam.  You may have a small amount of blood in your stool.  You may pass gas and have mild abdominal cramping or bloating due to the air that was used to inflate your colon during the exam.  It is up to you to get the results of your procedure. Ask your health care provider, or the department performing the procedure, when your results will be ready. This information is not intended to replace advice given to you by your health care provider. Make sure you discuss any questions you have with your health care provider. Document Released: 12/13/2000 Document Revised: 10/16/2016 Document Reviewed: 02/27/2016 Elsevier Interactive Patient Education  [employer].   The patient is scheduled for a Colonoscopy at [facility] on 06/17/18. They are aware to call the day before to get their arrival time. He will stop his Fish Oil one week prior. He will decrease his Toujeo to 1/2 dose and his Janumet to 1/2 tab once daily the day of prep and procedure. He will not need to adjust his Bydureon. He is aware to bring his C Pap machine with him to the hospital the day of. Miralax prescription has been sent into the patient's pharmacy. The patient is aware of date and instructions.

## 2018-06-13 DIAGNOSIS — Z1211 Encounter for screening for malignant neoplasm of colon: Secondary | ICD-10-CM | POA: Insufficient documentation

## 2018-06-17 ENCOUNTER — Ambulatory Visit
Admission: RE | Admit: 2018-06-17 | Discharge: 2018-06-17 | Disposition: A | Discharge status: Home / Self Care | Payer: PRIVATE HEALTH INSURANCE | Source: Ambulatory Visit | Attending: General Surgery | Admitting: General Surgery

## 2018-06-17 ENCOUNTER — Other Ambulatory Visit: Payer: Self-pay

## 2018-06-17 ENCOUNTER — Ambulatory Visit: Payer: PRIVATE HEALTH INSURANCE | Admitting: Anesthesiology

## 2018-06-17 ENCOUNTER — Encounter
Admission: RE | Disposition: A | Discharge status: Home / Self Care | Payer: Self-pay | Source: Ambulatory Visit | Attending: General Surgery

## 2018-06-17 DIAGNOSIS — D123 Benign neoplasm of transverse colon: Secondary | ICD-10-CM | POA: Insufficient documentation

## 2018-06-17 DIAGNOSIS — E785 Hyperlipidemia, unspecified: Secondary | ICD-10-CM | POA: Diagnosis not present

## 2018-06-17 DIAGNOSIS — K5730 Diverticulosis of large intestine without perforation or abscess without bleeding: Secondary | ICD-10-CM | POA: Insufficient documentation

## 2018-06-17 DIAGNOSIS — Z1211 Encounter for screening for malignant neoplasm of colon: Secondary | ICD-10-CM | POA: Diagnosis not present

## 2018-06-17 DIAGNOSIS — E119 Type 2 diabetes mellitus without complications: Secondary | ICD-10-CM | POA: Insufficient documentation

## 2018-06-17 DIAGNOSIS — Z79899 Other long term (current) drug therapy: Secondary | ICD-10-CM | POA: Diagnosis not present

## 2018-06-17 DIAGNOSIS — I10 Essential (primary) hypertension: Secondary | ICD-10-CM | POA: Insufficient documentation

## 2018-06-17 DIAGNOSIS — Z794 Long term (current) use of insulin: Secondary | ICD-10-CM | POA: Diagnosis not present

## 2018-06-17 DIAGNOSIS — G4730 Sleep apnea, unspecified: Secondary | ICD-10-CM | POA: Diagnosis not present

## 2018-06-17 HISTORY — PX: COLONOSCOPY WITH PROPOFOL: SHX5780

## 2018-06-17 LAB — GLUCOSE, CAPILLARY: Glucose-Capillary: 131 mg/dL — ABNORMAL HIGH (ref 65–99)

## 2018-06-17 SURGERY — COLONOSCOPY WITH PROPOFOL
Anesthesia: General

## 2018-06-17 MED ORDER — PROPOFOL 500 MG/50ML IV EMUL
INTRAVENOUS | Status: AC
  Filled 2018-06-17: qty 50

## 2018-06-17 MED ORDER — PROPOFOL 500 MG/50ML IV EMUL
INTRAVENOUS | Status: DC | PRN
  Administered 2018-06-17: 80 ug/kg/min via INTRAVENOUS

## 2018-06-17 MED ORDER — SODIUM CHLORIDE 0.9 % IV SOLN
INTRAVENOUS | Status: DC
  Administered 2018-06-17: 07:00:00 via INTRAVENOUS

## 2018-06-17 MED ORDER — SODIUM CHLORIDE 0.9 % IV SOLN
INTRAVENOUS | Status: DC | PRN
  Administered 2018-06-17: 07:00:00 via INTRAVENOUS

## 2018-06-17 MED ORDER — PROPOFOL 10 MG/ML IV BOLUS
INTRAVENOUS | Status: DC | PRN
  Administered 2018-06-17: 50 mg via INTRAVENOUS
  Administered 2018-06-17: 40 mg via INTRAVENOUS

## 2018-06-17 NOTE — Anesthesia Postprocedure Evaluation (Signed)
Anesthesia Post Note  Patient: Edward Gutierrez [facility]  Procedure(s) Performed: COLONOSCOPY WITH PROPOFOL (N/A )  Patient location during evaluation: Endoscopy Anesthesia Type: General Level of consciousness: awake and alert Pain management: pain level controlled Vital Signs Assessment: post-procedure vital signs reviewed and stable Respiratory status: spontaneous breathing, nonlabored ventilation, respiratory function stable and patient connected to nasal cannula oxygen Cardiovascular status: blood pressure returned to baseline and stable Postop Assessment: no apparent nausea or vomiting Anesthetic complications: no     Last Vitals:  Vitals:   06/17/18 0759 06/17/18 0800  BP: 129/89 129/89  Pulse:  75  Resp: 16 16  Temp:    SpO2: 98% 98%    Last Pain:  Vitals:   06/17/18 0800  TempSrc:   PainSc: 0-No pain                 Martha Clan

## 2018-06-17 NOTE — H&P (Signed)
No change in clinical history or exam. Tolerated prep well.  For colonoscopy.Screening.

## 2018-06-17 NOTE — Anesthesia Preprocedure Evaluation (Addendum)
Anesthesia Evaluation  Patient identified by MRN, date of birth, ID band Patient awake    Reviewed: Allergy & Precautions, H&P , NPO status , Patient's Chart, lab work & pertinent test results, reviewed documented beta blocker date and time   History of Anesthesia Complications Negative for: history of anesthetic complications  Airway Mallampati: I  TM Distance: >3 FB Neck ROM: full    Dental  (+) Dental Advidsory Given, Teeth Intact   Pulmonary neg shortness of breath, sleep apnea and Continuous Positive Airway Pressure Ventilation , neg COPD, neg recent URI, former smoker,           Cardiovascular Exercise Tolerance: Good hypertension, (-) angina(-) CAD, (-) Past MI, (-) Cardiac Stents and (-) CABG (-) dysrhythmias (-) Valvular Problems/Murmurs     Neuro/Psych negative neurological ROS  negative psych ROS   GI/Hepatic negative GI ROS, Neg liver ROS,   Endo/Other  diabetes  Renal/GU negative Renal ROS  negative genitourinary   Musculoskeletal   Abdominal   Peds  Hematology negative hematology ROS (+)   Anesthesia Other Findings Past Medical History: No date: Diabetes mellitus without complication ([place]) 5974: Diverticulitis No date: Hyperlipidemia No date: Hypertension No date: Joint pain No date: Sleep apnea     Comment:  c pap  Past Surgical History: 01/30/2007: HERNIA REPAIR     Comment:  Epigastric hernia repaired with small Kugwl patch 06/28/14: HERNIA REPAIR     Comment:  umbilical hernia     Reproductive/Obstetrics negative OB ROS                            Anesthesia Physical Anesthesia Plan  ASA: III  Anesthesia Plan: General   Post-op Pain Management:    Induction: Intravenous  PONV Risk Score and Plan: 2 and Treatment may vary due to age or medical condition and TIVA  Airway Management Planned: Nasal Cannula  Additional Equipment:   Intra-op Plan:    Post-operative Plan:   Informed Consent: I have reviewed the patients History and Physical, chart, labs and discussed the procedure including the risks, benefits and alternatives for the proposed anesthesia with the patient or authorized representative who has indicated his/her understanding and acceptance.   Dental Advisory Given  Plan Discussed with: CRNA  Anesthesia Plan Comments:        Anesthesia Quick Evaluation

## 2018-06-17 NOTE — Anesthesia Post-op Follow-up Note (Signed)
Anesthesia QCDR form completed.        

## 2018-06-17 NOTE — Op Note (Signed)
[facility]astroenter[facility] Patient Name: Edward Gutierrez Procedure Date: 06/17/2018 7:20 AM MRN: [id number] Account #: [id number] Date of Birth: [birth year] Admit Type: Outpatient Age: 52 Room: [facility] ENDO ROOM 1 Gender: Male Note Status: Finalized Procedure:            Colonoscopy Indications:          Screening for colorectal malignant neoplasm Providers:            Robert Bellow, MD Referring MD:         Lenard Simmer, MD (Referring MD) Medicines:            Monitored Anesthesia Care Complications:        No immediate complications. Procedure:            Pre-Anesthesia Assessment:                       - Prior to the procedure, a History and Physical was                        performed, and patient medications, allergies and                        sensitivities were reviewed. The patient's tolerance of                        previous anesthesia was reviewed.                       - The risks and benefits of the procedure and the                        sedation options and risks were discussed with the                        patient. All questions were answered and informed                        consent was obtained.                       After obtaining informed consent, the colonoscope was                        passed under direct vision. Throughout the procedure,                        the patient's blood pressure, pulse, and oxygen                        saturations were monitored continuously. The                        Colonoscope was introduced through the anus and                        advanced to the the cecum, identified by appendiceal                        orifice and ileocecal valve. The colonoscopy was  performed without difficulty. The patient tolerated the                        procedure well. The quality of the bowel preparation                        was excellent. Findings:      Multiple medium-mouthed  diverticula were found in the sigmoid colon and       descending colon.      A 6 mm polyp was found in the proximal transverse colon. The polyp was       sessile. This was removed with cold forceps for histology.      The retroflexed view of the distal rectum and anal verge was normal and       showed no anal or rectal abnormalities. Impression:           - Diverticulosis in the sigmoid colon and in the                        descending colon.                       - One 6 mm polyp in the proximal transverse colon.                        Biopsied.                       - The distal rectum and anal verge are normal on                        retroflexion view. Recommendation:       - Telephone endoscopist for pathology results in 1 week.                       - Use fiber, for example Citrucel, Fibercon, Konsyl or                        Metamucil daily to bulk stools and lower pressure in                        colon. Procedure Code(s):    --- Professional ---                       [id number], Colonoscopy, flexible; with biopsy, single or                        multiple Diagnosis Code(s):    --- Professional ---                       Z12.11, Encounter for screening for malignant neoplasm                        of colon                       D12.3, Benign neoplasm of transverse colon (hepatic                        flexure or splenic flexure)  K57.30, Diverticulosis of large intestine without                        perforation or abscess without bleeding CPT copyright 2017 American Medical Association. All rights reserved. The codes documented in this report are preliminary and upon coder review may  be revised to meet current compliance requirements. Robert Bellow, MD 06/17/2018 7:59:59 AM This report has been signed electronically. Number of Addenda: 0 Note Initiated On: 06/17/2018 7:20 AM Scope Withdrawal Time: 0 hours 14 minutes 48 seconds  Total Procedure  Duration: 0 hours 18 minutes 17 seconds       [facility]

## 2018-06-17 NOTE — Transfer of Care (Signed)
Immediate Anesthesia Transfer of Care Note  Patient: Edward Gutierrez [facility]  Procedure(s) Performed: COLONOSCOPY WITH PROPOFOL (N/A )  Patient Location: PACU  Anesthesia Type:General  Level of Consciousness: awake  Airway & Oxygen Therapy: Patient Spontanous Breathing  Post-op Assessment: Report given to RN  Post vital signs: stable  Last Vitals:  Vitals Value Taken Time  BP    Temp    Pulse    Resp    SpO2      Last Pain:  Vitals:   06/17/18 0703  PainSc: 10-Worst pain ever         Complications: No apparent anesthesia complications

## 2018-06-18 LAB — SURGICAL PATHOLOGY

## 2018-06-19 ENCOUNTER — Telehealth: Payer: Self-pay

## 2018-06-19 NOTE — Telephone Encounter (Signed)
Notified patient as instructed, patient pleased. Discussed follow-up appointments, patient agrees. Patient placed in recalls.   

## 2018-06-19 NOTE — Telephone Encounter (Signed)
-----   Message from Robert Bellow, MD sent at 06/19/2018  9:17 AM EDT -----  Please notify the patient is a polyp removed was benign.  Would recommend a follow-up exam in 5 years.  Please place and recalls.  Thank you ----- Message ----- From: Interface, Lab In Three Zero One Sent: 06/18/2018   7:00 PM To: Robert Bellow, MD

## 2019-12-22 ENCOUNTER — Telehealth: Payer: Self-pay

## 2019-12-22 NOTE — Telephone Encounter (Signed)
Called pt as a new pt and left a message and asked him to call back and schedule new consult with dr Devona Konig per his pcp. Beth

## 2020-02-18 ENCOUNTER — Telehealth: Payer: Self-pay

## 2020-02-18 NOTE — Telephone Encounter (Signed)
Confirmed appointment on 02/21/2020 and screened for covid. [facility]  

## 2020-02-21 ENCOUNTER — Other Ambulatory Visit: Payer: Self-pay

## 2020-02-21 ENCOUNTER — Ambulatory Visit: Payer: PRIVATE HEALTH INSURANCE | Admitting: Internal Medicine

## 2020-02-21 ENCOUNTER — Encounter: Payer: Self-pay | Admitting: Internal Medicine

## 2020-02-21 VITALS — BP 148/88 | HR 96 | Temp 98.3°F | Resp 16 | Ht 70.0 in | Wt 302.0 lb

## 2020-02-21 DIAGNOSIS — I10 Essential (primary) hypertension: Secondary | ICD-10-CM | POA: Diagnosis not present

## 2020-02-21 DIAGNOSIS — G4733 Obstructive sleep apnea (adult) (pediatric): Secondary | ICD-10-CM | POA: Diagnosis not present

## 2020-02-21 DIAGNOSIS — Z9989 Dependence on other enabling machines and devices: Secondary | ICD-10-CM

## 2020-02-21 NOTE — Progress Notes (Signed)
[facility] [place], [place] [address]  Pulmonary Sleep Medicine   Office Visit Note  Patient Name: Edward Gutierrez DOB: [birth year] MRN [id number]  Date of Service: 02/21/2020  Complaints/HPI: Pt seen today to establish care with pulmonary practice.  He is a well appearing 54 yo male.  He has a history of HTN, DM.  He is a former cigarette smoker, he quit about 14 years ago.  His A1C is well controlled currently.  His bp is generally controlled, but was elevated initially upon arrival. His follow up bp was 148/88. He does reports a decent amount of stress professionally and personally.     He reports he had a sleep study and was diagnosed with sleep apnea about 14 years ago.  He has been regularly wearing cpap.  His current machine is about 55 years old.  It is making noise, and he is having trouble getting supplies.    ROS  General: (-) fever, (-) chills, (-) night sweats, (-) weakness Skin: (-) rashes, (-) itching,. Eyes: (-) visual changes, (-) redness, (-) itching. Nose and Sinuses: (-) nasal stuffiness or itchiness, (-) postnasal drip, (-) nosebleeds, (-) sinus trouble. Mouth and Throat: (-) sore throat, (-) hoarseness. Neck: (-) swollen glands, (-) enlarged thyroid, (-) neck pain. Respiratory: - cough, (-) bloody sputum, - shortness of breath, - wheezing. Cardiovascular: - ankle swelling, (-) chest pain. Lymphatic: (-) lymph node enlargement. Neurologic: (-) numbness, (-) tingling. Psychiatric: (-) anxiety, (-) depression   Current Medication: Outpatient Encounter Medications as of 02/21/2020  Medication Sig Note  . benazepril (LOTENSIN) 40 MG tablet Take 1 tablet by mouth daily. 06/08/2014: Received from: External Pharmacy Received Sig:   . Choline Fenofibrate (FENOFIBRIC ACID) 135 MG CPDR daily.    . Exenatide ER (BYDUREON) 2 MG PEN Inject into the skin once a week.   Marland Kitchen JANUMET 50-1000 MG per tablet Take 1 tablet by mouth 2 (two) times daily.  06/08/2014: Received from: External Pharmacy Received Sig:   . Omega-3 1000 MG CAPS Take by mouth daily.   Marland Kitchen OZEMPIC, 1 MG/DOSE, 2 MG/1.5ML SOPN    . TOUJEO SOLOSTAR 300 UNIT/ML SOPN 30 Units daily.     No facility-administered encounter medications on file as of 02/21/2020.    Surgical History: Past Surgical History:  Procedure Laterality Date  . COLONOSCOPY WITH PROPOFOL N/A 06/17/2018   Procedure: COLONOSCOPY WITH PROPOFOL;  Surgeon: Robert Bellow, MD;  Location: [facility] ENDOSCOPY;  Service: Endoscopy;  Laterality: N/A;  . HERNIA REPAIR  01/30/2007   Epigastric hernia repaired with small Kugwl patch  . HERNIA REPAIR  [id number]   umbilical hernia    Medical History: Past Medical History:  Diagnosis Date  . Diabetes mellitus without complication ([place])   . Diverticulitis 2018  . Hyperlipidemia   . Hypertension   . Joint pain   . Sleep apnea    c pap    Family History: Family History  Problem Relation Age of Onset  . Cancer Mother        lung  . Cancer Sister        breast  . Diabetes Sister     Social History: Social History   Socioeconomic History  . Marital status: Married    Spouse name: Not on file  . Number of children: Not on file  . Years of education: Not on file  . Highest education level: Not on file  Occupational History  . Not on file  Tobacco Use  .  Smoking status: Former Smoker    Quit date: 12/30/2008    Years since quitting: 11.1  . Smokeless tobacco: Never Used  Substance and Sexual Activity  . Alcohol use: Yes    Comment: occasional  . Drug use: No  . Sexual activity: Not on file  Other Topics Concern  . Not on file  Social History Narrative  . Not on file   Social Determinants of Health   Financial Resource Strain:   . Difficulty of Paying Living Expenses: Not on file  Food Insecurity:   . Worried About Charity fundraiser in the Last Year: Not on file  . Ran Out of Food in the Last Year: Not on file  Transportation Needs:   .  Lack of Transportation (Medical): Not on file  . Lack of Transportation (Non-Medical): Not on file  Physical Activity:   . Days of Exercise per Week: Not on file  . Minutes of Exercise per Session: Not on file  Stress:   . Feeling of Stress : Not on file  Social Connections:   . Frequency of Communication with Friends and Family: Not on file  . Frequency of Social Gatherings with Friends and Family: Not on file  . Attends Religious Services: Not on file  . Active Member of Clubs or Organizations: Not on file  . Attends Archivist Meetings: Not on file  . Marital Status: Not on file  Intimate Partner Violence:   . Fear of Current or Ex-Partner: Not on file  . Emotionally Abused: Not on file  . Physically Abused: Not on file  . Sexually Abused: Not on file    Vital Signs: Blood pressure (!) 153/98, pulse 96, temperature 98.3 F (36.8 C), resp. rate 16, height 5\' 10"  (1.778 m), weight (!) 302 lb (137 kg), SpO2 97 %.  Examination: General Appearance: The patient is well-developed, well-nourished, and in no distress. Skin: Gross inspection of skin unremarkable. Head: normocephalic, no gross deformities. Eyes: no gross deformities noted. ENT: ears appear grossly normal no exudates. Neck: Supple. No thyromegaly. No LAD. Respiratory: clear bilaterally. Cardiovascular: Normal S1 and S2 without murmur or rub. Extremities: No cyanosis. pulses are equal. Neurologic: Alert and oriented. No involuntary movements.  LABS: No results found for this or any previous visit (from the past 2160 hour(s)).  Radiology: No results found.  No results found.  No results found.    Assessment and Plan: Patient Active Problem List   Diagnosis Date Noted  . Encounter for screening colonoscopy 06/13/2018    1. OSA on CPAP Pt is currently using a cpap that is 55 years old.  Pt needs new baseline PSG for evaluation for new machine.  2. Morbid obesity ([place]) Obesity Counseling: Risk  Assessment: An assessment of behavioral risk factors was made today and includes lack of exercise sedentary lifestyle, lack of portion control and poor dietary habits.  Risk Modification Advice: She was counseled on portion control guidelines. Restricting daily caloric intake to 1800. The detrimental long term effects of obesity on her health and ongoing poor compliance was also discussed with the patient.  3. Essential hypertension Stable, continue current medications.   General Counseling: I have discussed the findings of the evaluation and examination with Jeneen Rinks.  I have also discussed any further diagnostic evaluation thatmay be needed or ordered today. Johntavius verbalizes understanding of the findings of todays visit. We also reviewed his medications today and discussed drug interactions and side effects including but not limited excessive drowsiness  and altered mental states. We also discussed that there is always a risk not just to him but also people around him. he has been encouraged to call the office with any questions or concerns that should arise related to todays visit.  No orders of the defined types were placed in this encounter.    Time spent: 30 This patient was seen by Orson Gear AGNP-C in Collaboration with Dr. Devona Konig as a part of collaborative care agreement.   I have personally obtained a history, examined the patient, evaluated laboratory and imaging results, formulated the assessment and plan and placed orders.    Allyne Gee, MD [facility] Sleep medicine

## 2020-02-24 ENCOUNTER — Telehealth: Payer: Self-pay

## 2020-03-30 ENCOUNTER — Ambulatory Visit: Payer: Self-pay | Admitting: Internal Medicine

## 2020-06-05 NOTE — Telephone Encounter (Signed)
Error in creating documentation from 01/2020

## 2021-03-03 ENCOUNTER — Emergency Department
Admission: EM | Admit: 2021-03-03 | Discharge: 2021-03-03 | Disposition: A | Discharge status: Home / Self Care | Payer: PRIVATE HEALTH INSURANCE | Attending: Emergency Medicine | Admitting: Emergency Medicine

## 2021-03-03 ENCOUNTER — Emergency Department: Payer: PRIVATE HEALTH INSURANCE

## 2021-03-03 ENCOUNTER — Other Ambulatory Visit: Payer: Self-pay

## 2021-03-03 ENCOUNTER — Encounter: Payer: Self-pay | Admitting: Emergency Medicine

## 2021-03-03 DIAGNOSIS — Z87891 Personal history of nicotine dependence: Secondary | ICD-10-CM | POA: Insufficient documentation

## 2021-03-03 DIAGNOSIS — M25561 Pain in right knee: Secondary | ICD-10-CM | POA: Diagnosis present

## 2021-03-03 DIAGNOSIS — Z79899 Other long term (current) drug therapy: Secondary | ICD-10-CM | POA: Insufficient documentation

## 2021-03-03 DIAGNOSIS — Y9301 Activity, walking, marching and hiking: Secondary | ICD-10-CM | POA: Diagnosis not present

## 2021-03-03 DIAGNOSIS — M2391 Unspecified internal derangement of right knee: Secondary | ICD-10-CM

## 2021-03-03 DIAGNOSIS — I10 Essential (primary) hypertension: Secondary | ICD-10-CM | POA: Diagnosis not present

## 2021-03-03 DIAGNOSIS — Z794 Long term (current) use of insulin: Secondary | ICD-10-CM | POA: Insufficient documentation

## 2021-03-03 DIAGNOSIS — Y92094 Garage of other non-institutional residence as the place of occurrence of the external cause: Secondary | ICD-10-CM | POA: Diagnosis not present

## 2021-03-03 DIAGNOSIS — E119 Type 2 diabetes mellitus without complications: Secondary | ICD-10-CM | POA: Insufficient documentation

## 2021-03-03 DIAGNOSIS — W010XXA Fall on same level from slipping, tripping and stumbling without subsequent striking against object, initial encounter: Secondary | ICD-10-CM | POA: Insufficient documentation

## 2021-03-03 DIAGNOSIS — W19XXXA Unspecified fall, initial encounter: Secondary | ICD-10-CM

## 2021-03-03 MED ORDER — MELOXICAM 15 MG PO TABS: 15.0000 mg | ORAL_TABLET | Freq: Every day | ORAL | 2 refills | Status: AC

## 2021-03-03 NOTE — Discharge Instructions (Signed)
Follow-up with your regular doctor if not improving in 5 to 7 days.  Return emergency department worsening.  Follow-up with orthopedics if continued knee pain.  Apply ice to the knee as much as possible.  Keep elevated above a heart.  Wear the knee immobilizer during ambulation.  Use crutches to avoid putting pressure on the knee

## 2021-03-03 NOTE — ED Provider Notes (Signed)
[facility] Emergency Department Provider Note  ____________________________________________   None    (approximate)  I have reviewed the triage vital signs and the nursing notes.   HISTORY  Chief Complaint Leg Injury    HPI Edward Gutierrez is a 55 y.o. male presents emergency department via EMS after a fall at home.  He states he was walking into his garage and slipped twisting the left knee and then landing towards the right and felt a very large pop and severe pain.  Been unable to bear weight since this happened.  He received 100 a fentanyl on the way in by EMS.   Patient states he has no other injuries.  His neck, his back, and his left leg do not hurt.  Patient has history of diabetes and hypertension.   Past Medical History:  Diagnosis Date  . Diabetes mellitus without complication ([place])   . Diverticulitis 2018  . Hyperlipidemia   . Hypertension   . Joint pain   . Sleep apnea    c pap    Patient Active Problem List   Diagnosis Date Noted  . Encounter for screening colonoscopy 06/13/2018    Past Surgical History:  Procedure Laterality Date  . COLONOSCOPY WITH PROPOFOL N/A 06/17/2018   Procedure: COLONOSCOPY WITH PROPOFOL;  Surgeon: Robert Bellow, MD;  Location: [facility] ENDOSCOPY;  Service: Endoscopy;  Laterality: N/A;  . HERNIA REPAIR  01/30/2007   Epigastric hernia repaired with small Kugwl patch  . HERNIA REPAIR  04/07/72   umbilical hernia    Prior to Admission medications   Medication Sig Start Date End Date Taking? Authorizing Provider  meloxicam (MOBIC) 15 MG tablet Take 1 tablet (15 mg total) by mouth daily. 03/03/21 03/03/22 Yes Fisher, Linden Dolin, PA-C  benazepril (LOTENSIN) 40 MG tablet Take 1 tablet by mouth daily. 04/27/14   [provider]  Choline Fenofibrate (FENOFIBRIC ACID) 135 MG CPDR daily.  06/09/18   [provider]  Exenatide ER (BYDUREON) 2 MG PEN Inject into the skin once a week.    [provider]  JANUMET 50-1000 MG per tablet Take 1 tablet by mouth 2 (two) times daily. 05/08/14   [provider]  Omega-3 1000 MG CAPS Take by mouth daily.    [provider]  OZEMPIC, 1 MG/DOSE, 2 MG/1.5ML SOPN  12/20/19   [provider]  TOUJEO SOLOSTAR 300 UNIT/ML SOPN 30 Units daily.  05/27/18   [provider]    Allergies Codeine  Family History  Problem Relation Age of Onset  . Cancer Mother        lung  . Cancer Sister        breast  . Diabetes Sister     Social History Social History   Tobacco Use  . Smoking status: Former Smoker    Quit date: 12/30/2008    Years since quitting: 12.1  . Smokeless tobacco: Never Used  Substance Use Topics  . Alcohol use: Yes    Comment: occasional  . Drug use: No    Review of Systems  Constitutional: No fever/chills Eyes: No visual changes. ENT: No sore throat. Respiratory: Denies cough Cardiovascular: Denies chest pain Gastrointestinal: Denies abdominal pain Genitourinary: Negative for dysuria. Musculoskeletal: Negative for back pain.  Positive for right knee/femur pain Skin: Negative for rash. Psychiatric: no mood changes,     ____________________________________________   PHYSICAL EXAM:  VITAL SIGNS: ED Triage Vitals  Enc Vitals Group     BP  03/03/21 1248 132/78     Pulse Rate 03/03/21 1248 66     Resp 03/03/21 1248 18     Temp 03/03/21 1248 99.3 F (37.4 C)     Temp Source 03/03/21 1248 Oral     SpO2 03/03/21 1248 97 %     Weight 03/03/21 1250 300 lb (136.1 kg)     Height 03/03/21 1250 5\' 10"  (1.778 m)     Head Circumference --      Peak Flow --      Pain Score 03/03/21 1249 3     Pain Loc --      Pain Edu? --      Excl. in [place]? --     Constitutional: Alert and oriented. Well appearing and in no acute distress. Eyes: Conjunctivae are normal.  Head: Atraumatic. Nose: No congestion/rhinnorhea. Mouth/Throat: Mucous membranes are moist.   Neck:  supple no  lymphadenopathy noted Cardiovascular: Normal rate, regular rhythm. Heart sounds are normal Respiratory: Normal respiratory effort.  No retractions, lungs c t a  Abd: soft nontender bs normal all 4 quad GU: deferred Musculoskeletal: Decreased range of motion of the right knee, right femur is tender up to the midshaft, knee is tender at the joint line and patella, neurovascular is intact, patient is guarding and cannot perform range of motion at this time  neurologic:  Normal speech and language.  Skin:  Skin is warm, dry and intact. No rash noted. Psychiatric: Mood and affect are normal. Speech and behavior are normal.  ____________________________________________   LABS (all labs ordered are listed, but only abnormal results are displayed)  Labs Reviewed - No data to display ____________________________________________   ____________________________________________  RADIOLOGY  X-ray of the right knee and right femur  ____________________________________________   PROCEDURES  Procedure(s) performed: Knee immobilizer and crutch  Procedures    ____________________________________________   INITIAL IMPRESSION / ASSESSMENT AND PLAN / ED COURSE  Pertinent labs & imaging results that were available during my care of the patient were reviewed by me and considered in my medical decision making (see chart for details).   Patient is a 55 year old male presents after a fall.  See HPI.  The physical exam shows patient appears stable but does have tenderness along the right thigh and right knee.  DDx: Femur fracture, meniscal tear, patella fracture, fall  X-ray of the right femur and right knee ordered   X-ray reviewed by me confirmed by radiology does not show fracture.  Did explain these findings to the patient.  He is to follow-up with orthopedics.  Explained to him this is more of a soft tissue injury to be a meniscal tear.  He is to apply ice as much as possible.  Use  crutches to bear weight.  Return if worsening.  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  He was discharged in stable condition.  [facility] was evaluated in Emergency Department on 03/03/2021 for the symptoms described in the history of present illness. He was evaluated in the context of the global COVID-19 pandemic, which necessitated consideration that the patient might be at risk for infection with the SARS-CoV-2 virus that causes COVID-19. Institutional protocols and algorithms that pertain to the evaluation of patients at risk for COVID-19 are in a state of rapid change based on information released by regulatory bodies including the CDC and federal and state organizations. These policies and algorithms were followed during  the patient's care in the ED.    As part of my medical decision making, I reviewed the following data within the [place] notes reviewed and incorporated, Old chart reviewed, Radiograph reviewed , Notes from prior ED visits and [place] Controlled Substance Database  ____________________________________________   FINAL CLINICAL IMPRESSION(S) / ED DIAGNOSES  Final diagnoses:  Internal derangement of right knee      NEW MEDICATIONS STARTED DURING THIS VISIT:  New Prescriptions   MELOXICAM (MOBIC) 15 MG TABLET    Take 1 tablet (15 mg total) by mouth daily.     Note:  This document was prepared using Dragon voice recognition software and may include unintentional dictation errors.    Versie Starks, PA-C 03/03/21 1339    Lavonia Drafts, MD 03/03/21 1346

## 2021-03-03 NOTE — ED Triage Notes (Signed)
Pt vi EMS. Came in after a fall at  Home in garage. Pt was walking when he loss balance put all his weight on his right side and heard a pop in right knee/ femur. Opt received 116mcg of fentanyl by ems. Currently rates pain 3/10 at this time. Pain worst with applying pressure on leg.

## 2021-12-20 ENCOUNTER — Encounter: Payer: Self-pay | Admitting: Internal Medicine

## 2022-09-16 IMAGING — CR DG KNEE COMPLETE 4+V*R*
4 series · 4 of 4 positions shown · non-contrast
Comparison: None.

CLINICAL DATA: Acute RIGHT knee pain following fall. Initial
encounter.

EXAM:
RIGHT KNEE - COMPLETE 4+ VIEW

[knee ap]
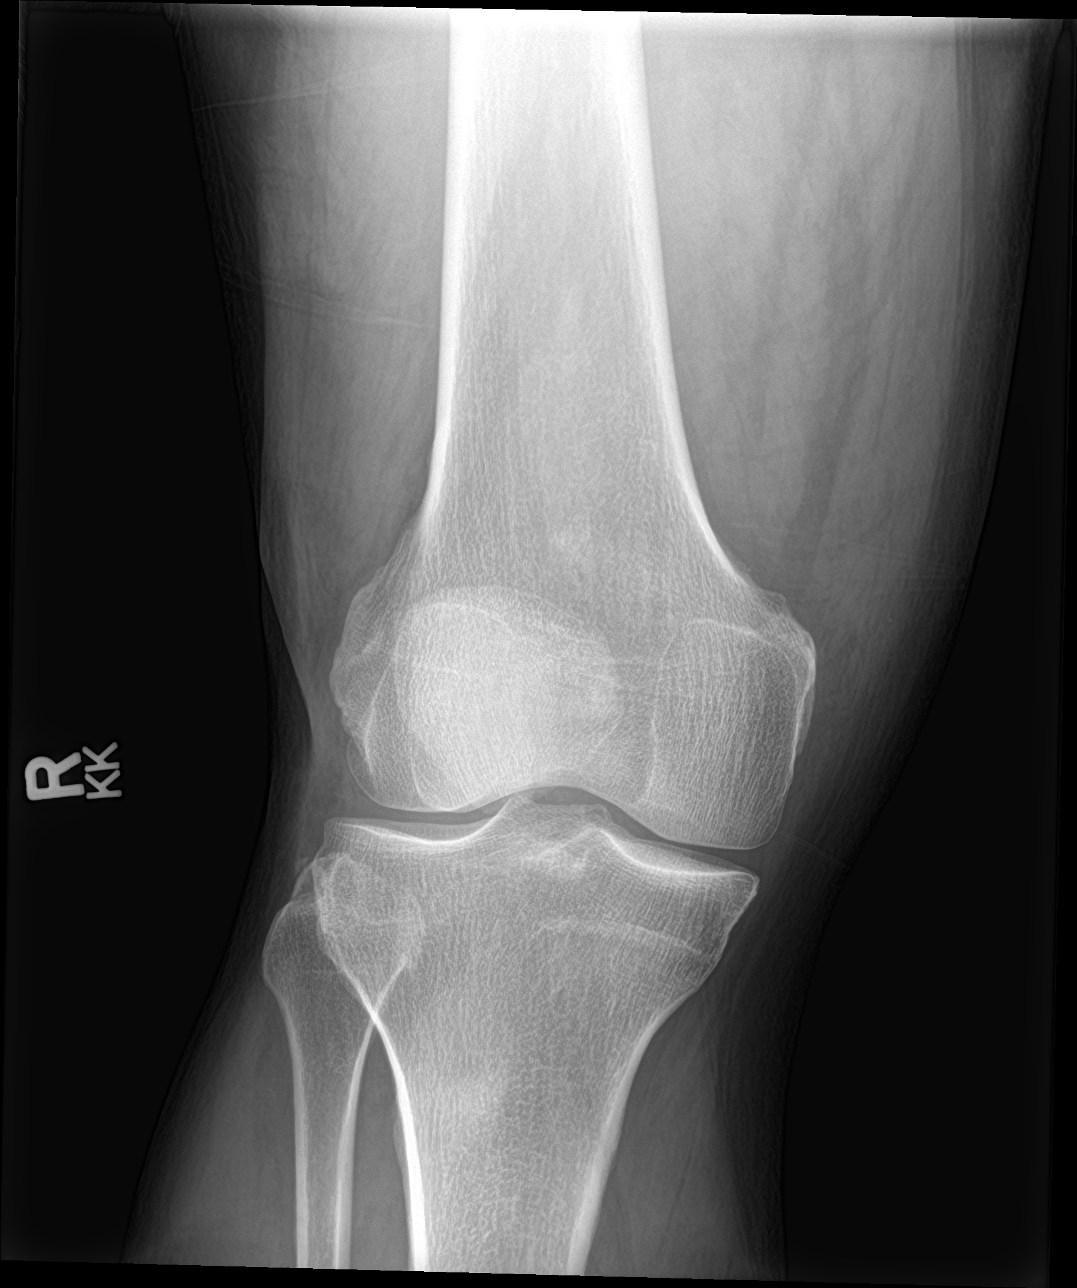

[knee obl (1 of 2)]
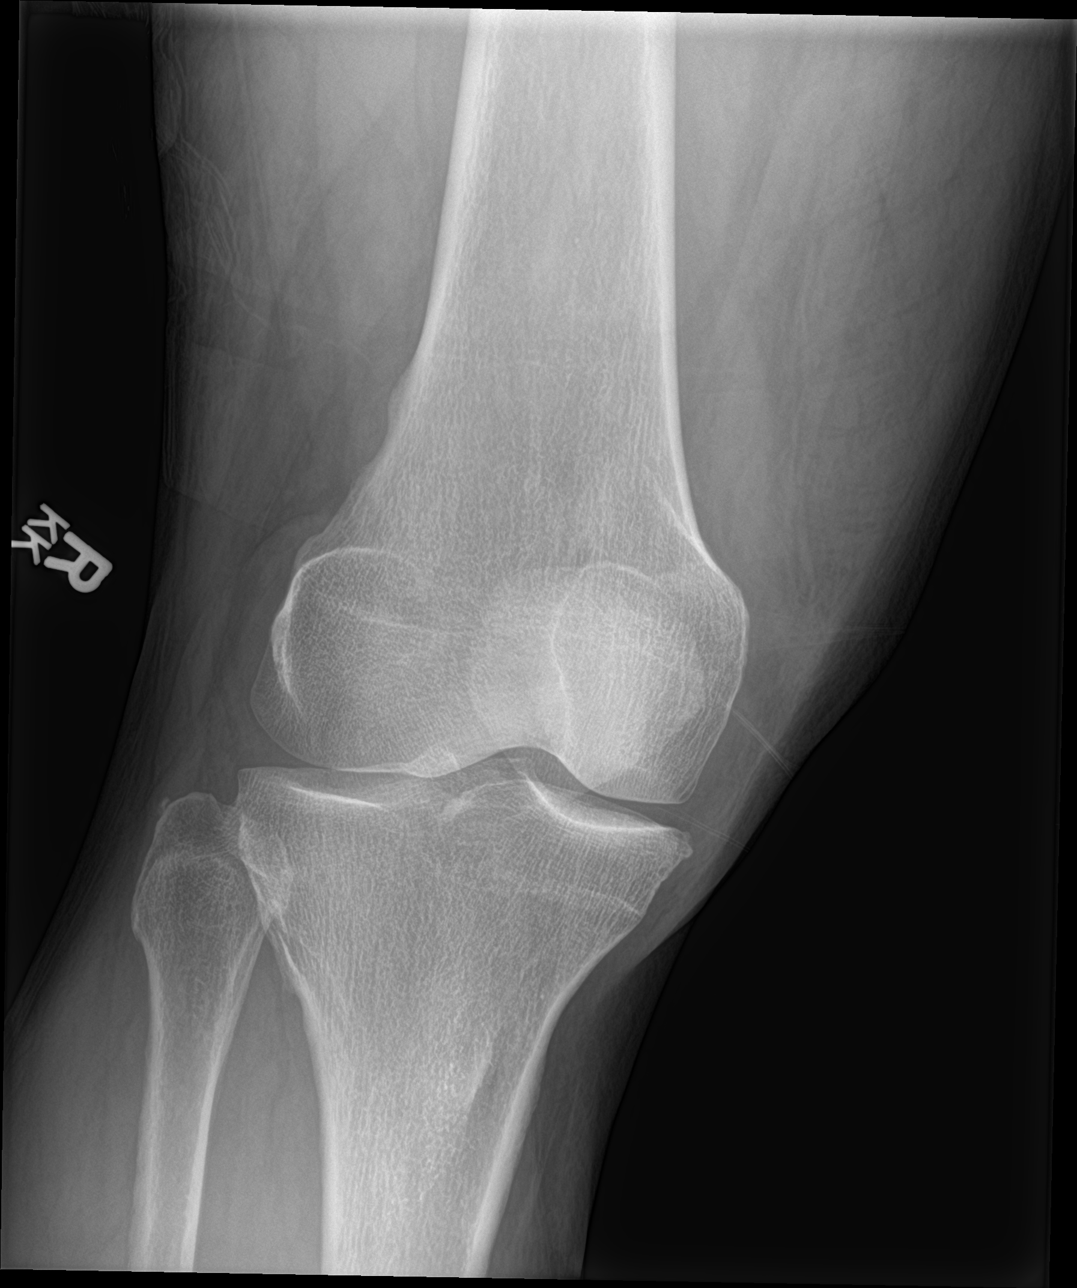

[knee obl (2 of 2)]
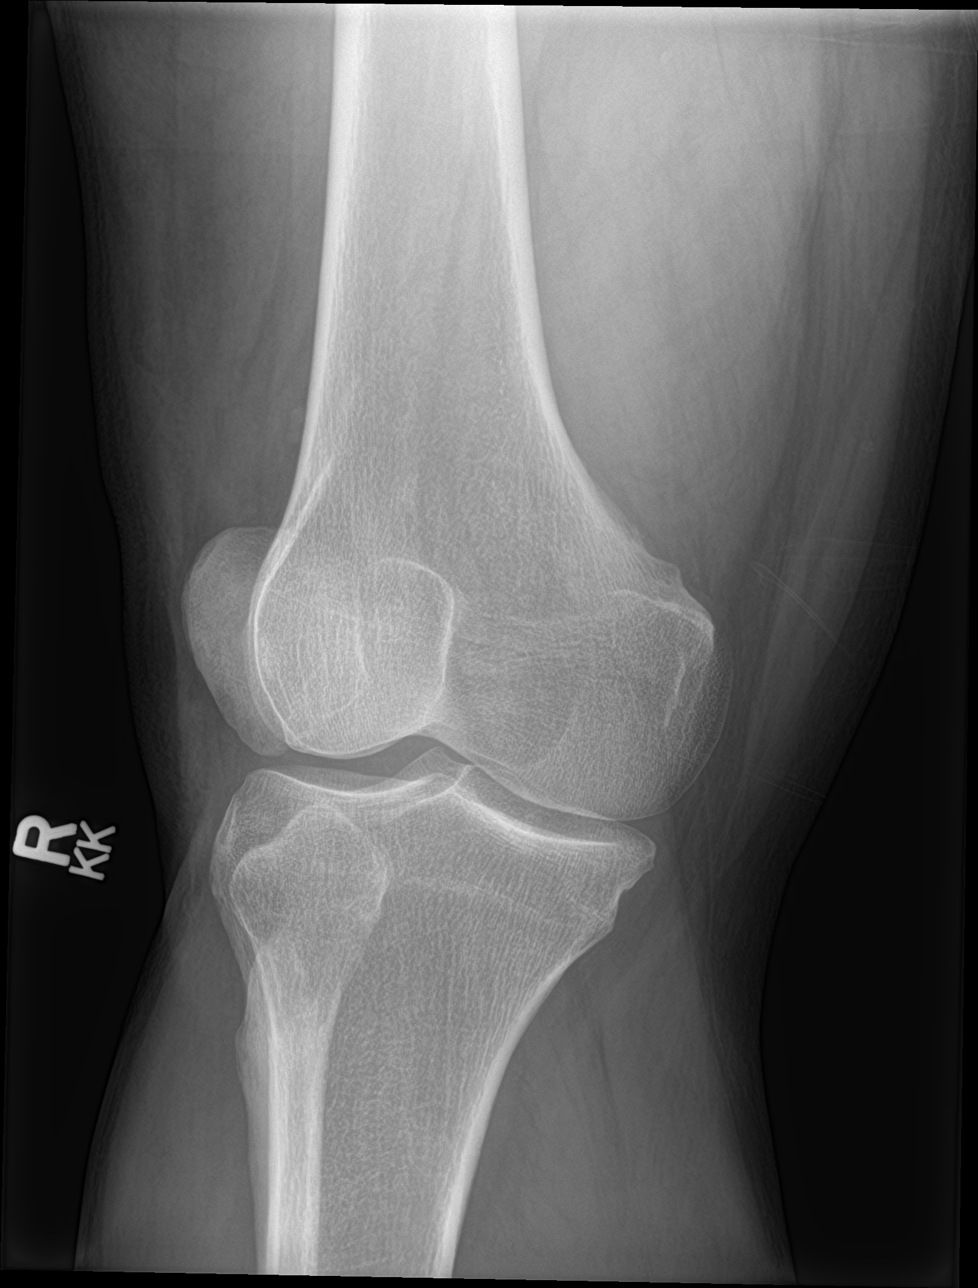

[knee lat]
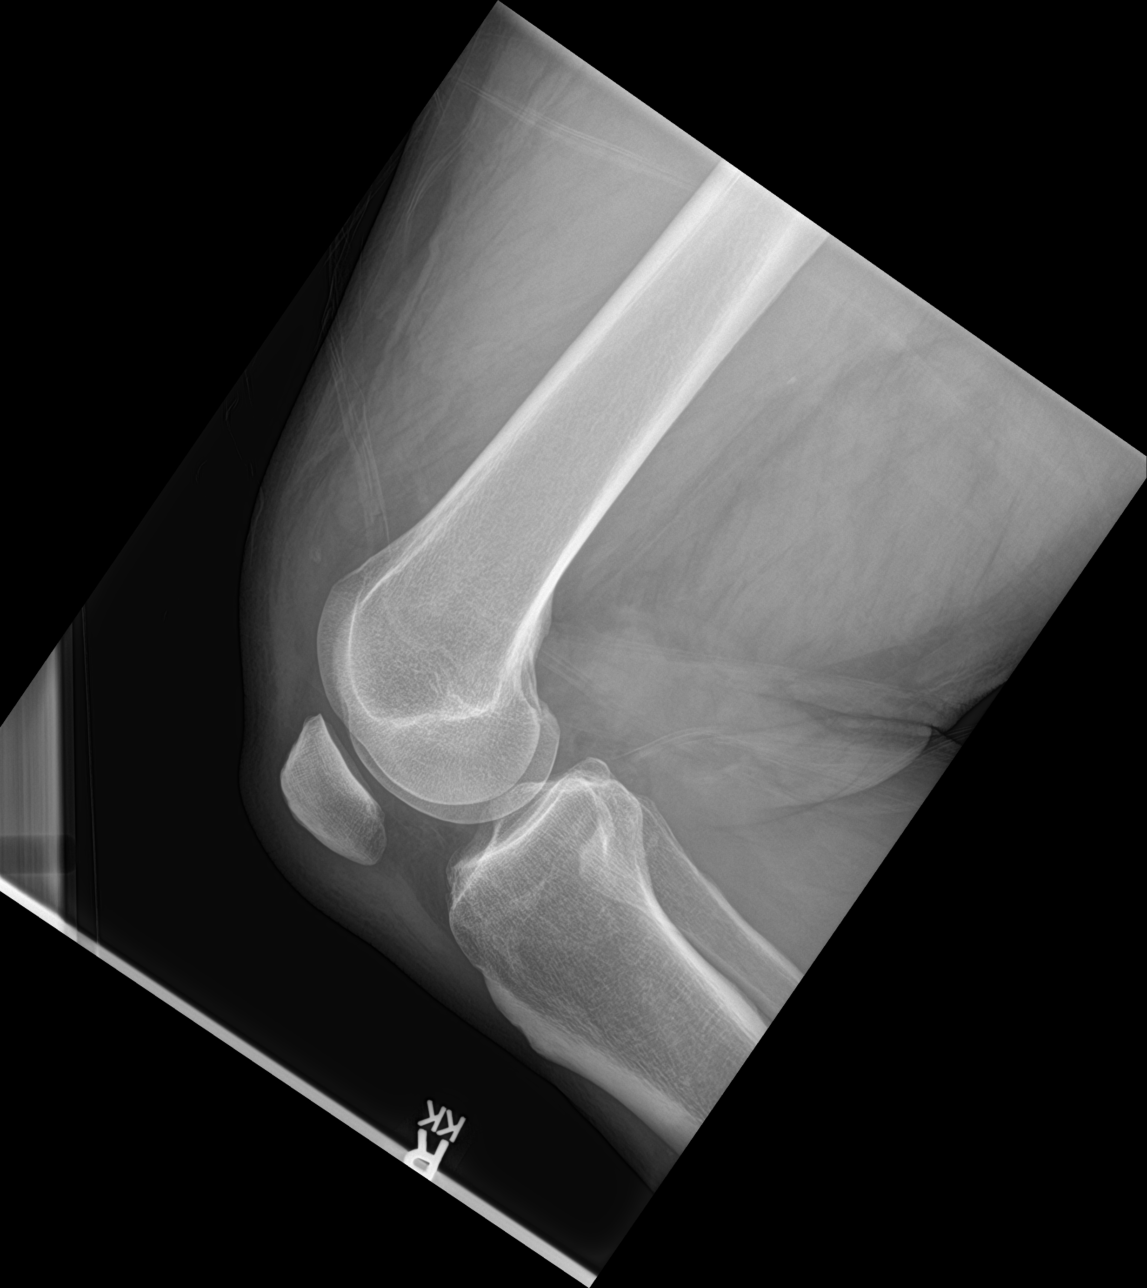

[4 of 4 positions shown; findings below may reference images not displayed]

FINDINGS: Equivocal small knee effusion is present.

There is no evidence of fracture, subluxation or dislocation.

Mild tricompartmental joint space narrowing is noted.

Prepatellar soft tissue swelling is identified.
IMPRESSION: 1. Prepatellar soft tissue swelling with equivocal small knee
effusion. No evidence of acute bony abnormality.
2. Mild tricompartmental degenerative changes.

## 2023-04-23 ENCOUNTER — Telehealth: Payer: Self-pay

## 2023-04-23 NOTE — Telephone Encounter (Signed)
Message left for the patient to call back. The patient is due for their Colonoscopy. Their last colonoscopy was done 06/17/18 by Dr Lemar Livings. Dr Lemar Livings is retired and non of our providers do this service. We would like to know if you would like for [place] to send a referral to [facility] or to [facility] to have them contact you to get this scheduled.

## 2023-10-13 ENCOUNTER — Telehealth: Payer: Self-pay | Admitting: Nurse Practitioner

## 2023-10-13 NOTE — Telephone Encounter (Signed)
Pt is calling to schedule a NPA please advise CB- [phone]

## 2023-12-05 ENCOUNTER — Encounter: Payer: Self-pay | Admitting: Nurse Practitioner

## 2023-12-05 ENCOUNTER — Ambulatory Visit: Payer: No Typology Code available for payment source | Admitting: Nurse Practitioner

## 2023-12-05 VITALS — BP 132/70 | HR 93 | Temp 97.8°F | Resp 16 | Ht 70.0 in | Wt 301.5 lb

## 2023-12-05 DIAGNOSIS — Z7689 Persons encountering health services in other specified circumstances: Secondary | ICD-10-CM

## 2023-12-05 DIAGNOSIS — E782 Mixed hyperlipidemia: Secondary | ICD-10-CM

## 2023-12-05 DIAGNOSIS — E1165 Type 2 diabetes mellitus with hyperglycemia: Secondary | ICD-10-CM

## 2023-12-05 DIAGNOSIS — I10 Essential (primary) hypertension: Secondary | ICD-10-CM

## 2023-12-05 DIAGNOSIS — Z1159 Encounter for screening for other viral diseases: Secondary | ICD-10-CM

## 2023-12-05 DIAGNOSIS — G4733 Obstructive sleep apnea (adult) (pediatric): Secondary | ICD-10-CM | POA: Diagnosis not present

## 2023-12-05 DIAGNOSIS — Z114 Encounter for screening for human immunodeficiency virus [HIV]: Secondary | ICD-10-CM

## 2023-12-05 DIAGNOSIS — L84 Corns and callosities: Secondary | ICD-10-CM

## 2023-12-05 DIAGNOSIS — R0789 Other chest pain: Secondary | ICD-10-CM

## 2023-12-05 DIAGNOSIS — Z794 Long term (current) use of insulin: Secondary | ICD-10-CM

## 2023-12-05 MED ORDER — BENAZEPRIL HCL 40 MG PO TABS: 40.0000 mg | ORAL_TABLET | Freq: Every day | ORAL | 1 refills | Status: DC

## 2023-12-05 MED ORDER — FENOFIBRIC ACID 135 MG PO CPDR: 135.0000 mg | DELAYED_RELEASE_CAPSULE | Freq: Every day | ORAL | 1 refills | Status: DC

## 2023-12-05 MED ORDER — JANUMET 50-1000 MG PO TABS: 1.0000 | ORAL_TABLET | Freq: Two times a day (BID) | ORAL | 1 refills | Status: DC

## 2023-12-05 MED ORDER — OZEMPIC (1 MG/DOSE) 2 MG/1.5ML ~~LOC~~ SOPN: 1.0000 mg | PEN_INJECTOR | SUBCUTANEOUS | 1 refills | Status: DC

## 2023-12-05 NOTE — Progress Notes (Signed)
BP 132/70 (BP Location: Right Arm, Patient Position: Sitting, Cuff Size: Large)   Pulse 93   Temp 97.8 F (36.6 C) (Oral)   Resp 16   Ht 5\' 10"  (1.778 m) Comment: per patient  Wt (!) 301 lb 8 oz (136.8 kg)   SpO2 99%   BMI 43.26 kg/m    Subjective:    Patient ID: Edward Gutierrez, male    DOB: 04-17-1966, 57 y.o.   MRN: [id number]  HPI: Edward Gutierrez is a 57 y.o. male  Chief Complaint  Patient presents with   Establish Care   Discussed the use of AI scribe software for clinical note transcription with the patient, who gave verbal consent to proceed.  History of Present Illness   The patient, with a history of type 2 diabetes, hypertension, hyperlipidemia, obesity, and sleep apnea, presents to establish care. He reports his last A1c was around 7.2 and his fasting blood sugars have been around 120. He is currently on Toujeo, Ozempic, and Janumet for diabetes management, and benazepril for hypertension. He also takes fenofibrate for hyperlipidemia. He reports occasional episodes of feeling "funny" with lightheadedness, but these episodes are not associated with abnormal blood sugar or blood pressure readings. He also reports a twinge of chest pain that lasts for about an hour and resolves on its own. This pain is not associated with activity and is described as more of a surface sensation. reports this has been happening for years.  He has a history of smoking but quit 15 years ago and has been using nicotine-free vapes intermittently. He also has a history of a completely separated quadricep tendon, which has since healed but left some residual numbness. He reports no issues with his feet apart from a persistent corn.   Relevant past medical, surgical, family and social history reviewed and updated as indicated. Interim medical history since our last visit reviewed. Allergies and medications reviewed and updated.  Review of Systems  Constitutional: Negative for fever or  weight change.  Respiratory: Negative for cough and shortness of breath.   Cardiovascular: Negative for chest pain or palpitations.  Gastrointestinal: Negative for abdominal pain, no bowel changes.  Musculoskeletal: Negative for gait problem or joint swelling.  Skin: Negative for rash.  Neurological: Negative for dizziness or headache.  No other specific complaints in a complete review of systems (except as listed in HPI above).      Objective:    BP 132/70 (BP Location: Right Arm, Patient Position: Sitting, Cuff Size: Large)   Pulse 93   Temp 97.8 F (36.6 C) (Oral)   Resp 16   Ht 5\' 10"  (1.778 m) Comment: per patient  Wt (!) 301 lb 8 oz (136.8 kg)   SpO2 99%   BMI 43.26 kg/m   Wt Readings from Last 3 Encounters:  12/05/23 (!) 301 lb 8 oz (136.8 kg)  03/03/21 300 lb (136.1 kg)  02/21/20 (!) 302 lb (137 kg)    Physical Exam  Constitutional: Patient appears well-developed and well-nourished. Obese  No distress.  HEENT: head atraumatic, normocephalic, pupils equal and reactive to light, neck supple Cardiovascular: Normal rate, regular rhythm and normal heart sounds.  No murmur heard. No BLE edema. Pulmonary/Chest: Effort normal and breath sounds normal. No respiratory distress. Abdominal: Soft.  There is no tenderness. Psychiatric: Patient has a normal mood and affect. behavior is normal. Judgment and thought content normal.  Diabetic Foot Exam - Simple   Simple Foot Form Diabetic Foot exam was performed  with the following findings: Yes 12/05/2023  3:04 PM  Visual Inspection No deformities, no ulcerations, no other skin breakdown bilaterally: Yes Sensation Testing Intact to touch and monofilament testing bilaterally: Yes Pulse Check Posterior Tibialis and Dorsalis pulse intact bilaterally: Yes Comments     Results for orders placed or performed during the hospital encounter of 06/17/18  Glucose, capillary  Result Value Ref Range   Glucose-Capillary 131 (H) 65 - 99 mg/dL   Surgical pathology  Result Value Ref Range   SURGICAL PATHOLOGY      Surgical Pathology CASE: [id number] PATIENT: Orpah Clinton Surgical Pathology Report     SPECIMEN SUBMITTED: A. Colon polyp, proximal transverse; cbx  CLINICAL HISTORY: None provided  PRE-OPERATIVE DIAGNOSIS: Screening  POST-OPERATIVE DIAGNOSIS: Diverticulosis; colon polyp     DIAGNOSIS: A.  COLON POLYP, PROXIMAL TRANSVERSE; COLD BIOPSY: - TUBULAR ADENOMA. - NEGATIVE FOR HIGH-GRADE DYSPLASIA AND MALIGNANCY.   GROSS DESCRIPTION: A. Labeled: Cbx polyp proximal transverse colon Received: In formalin Tissue fragment(s): Multiple Size: Aggregate, 1.1 x 0.3 x 0.1 cm Description: Pink-tan fragments Entirely submitted in one cassette.   Final Diagnosis performed by Ronald Lobo, MD.   Electronically signed 06/18/2018 6:45:12PM The electronic signature indicates that the named Attending Pathologist has evaluated the specimen  Technical component performed at [facility], [address], [place], [place] [address] Lab: [phone] Dir: Jolene Schimke, MD,  MMM  Professional component performed at [facility], [facility], [address] [place], [place], [place] [address] Lab: [phone] Dir: Georgiann Cocker. Rubinas, MD       Assessment & Plan:   Problem List Items Addressed This Visit       Cardiovascular and Mediastinum   Hypertension - Primary   Relevant Medications   benazepril (LOTENSIN) 40 MG tablet   Choline Fenofibrate (FENOFIBRIC ACID) 135 MG CPDR   Other Relevant Orders   CBC with Differential/Platelet   COMPLETE METABOLIC PANEL WITH GFR     Respiratory   OSA (obstructive sleep apnea)     Endocrine   Type 2 diabetes mellitus (HCC)   Relevant Medications   OZEMPIC, 1 MG/DOSE, 2 MG/1.5ML SOPN   sitaGLIPtin-metformin (JANUMET) 50-1000 MG tablet   benazepril (LOTENSIN) 40 MG tablet   Other Relevant Orders   COMPLETE METABOLIC PANEL WITH GFR   Hemoglobin A1c   Microalbumin  / creatinine urine ratio   HM Diabetes Foot Exam (Completed)     Other   Hyperlipemia   Relevant Medications   benazepril (LOTENSIN) 40 MG tablet   Choline Fenofibrate (FENOFIBRIC ACID) 135 MG CPDR   Other Relevant Orders   COMPLETE METABOLIC PANEL WITH GFR   Lipid panel   Morbid obesity (HCC)   Relevant Medications   OZEMPIC, 1 MG/DOSE, 2 MG/1.5ML SOPN   sitaGLIPtin-metformin (JANUMET) 50-1000 MG tablet   Other Visit Diagnoses     Encounter to establish care       Encounter for hepatitis C screening test for low risk patient       Relevant Orders   Hepatitis C antibody   Screening for HIV without presence of risk factors       Relevant Orders   HIV Antibody (routine testing w rflx)   Left-sided chest wall pain       Corn           Assessment and Plan    Type 2 Diabetes Mellitus Last A1c was 7.2. Fasting blood sugars at home around 120. Currently on Toujeo 45 units daily, Ozempic 1mg  weekly, and Janumet 50/1000mg  BID. -  Continue current regimen. -Refill Ozempic and Janumet  -Check microalbumin in urine and perform foot exam.  Hypertension No recent blood pressure readings, but patient checks when feeling unwell. Currently on Benazepril 40mg  daily. -Continue current regimen. -Encourage regular blood pressure monitoring.  Hyperlipidemia Currently on Fenofibrate. -Continue current regimen.  Obstructive Sleep Apnea Using CPAP machine nightly with good tolerance. -Continue current regimen.  Foot Corn Recurrent despite self-care. -Consider referral to podiatry if not improving.  Intermittent Chest Discomfort Described as a surface twinge, lasting up to an hour, not associated with activity. -Consider further evaluation if symptoms persist or worsen.  Tobacco Use Quit smoking cigarettes 15 years ago, occasional use of nicotine-free vapes. -Encourage complete cessation of vaping.   obesity comorbidities include HTN, HLD, OSA, DM  General Health  Maintenance -Up to date on eye exam and colonoscopy. -Consider diet and exercise counseling for obesity (BMI 43.26).        Follow up plan: Return in about 4 months (around 04/04/2024) for follow up.

## 2023-12-06 LAB — COMPLETE METABOLIC PANEL WITH GFR
AG Ratio: 1.9 (calc) (ref 1.0–2.5)
ALT: 32 U/L (ref 9–46)
AST: 22 U/L (ref 10–35)
Albumin: 4.5 g/dL (ref 3.6–5.1)
Alkaline phosphatase (APISO): 50 U/L (ref 35–144)
BUN: 16 mg/dL (ref 7–25)
CO2: 26 mmol/L (ref 20–32)
Calcium: 9.6 mg/dL (ref 8.6–10.3)
Chloride: 102 mmol/L (ref 98–110)
Creat: 0.81 mg/dL (ref 0.70–1.30)
Globulin: 2.4 g/dL (ref 1.9–3.7)
Glucose, Bld: 141 mg/dL — ABNORMAL HIGH (ref 65–99)
Potassium: 4 mmol/L (ref 3.5–5.3)
Sodium: 138 mmol/L (ref 135–146)
Total Bilirubin: 0.4 mg/dL (ref 0.2–1.2)
Total Protein: 6.9 g/dL (ref 6.1–8.1)
eGFR: 102 mL/min/{1.73_m2} (ref >=60)

## 2023-12-06 LAB — CBC WITH DIFFERENTIAL/PLATELET
Absolute Lymphocytes: 1157 {cells}/uL (ref 850–3900)
Absolute Monocytes: 614 {cells}/uL (ref 200–950)
Basophils Absolute: 98 {cells}/uL (ref 0–200)
Basophils Relative: 1.1 %
Eosinophils Absolute: 329 {cells}/uL (ref 15–500)
Eosinophils Relative: 3.7 %
HCT: 43.6 % (ref 38.5–50.0)
Hemoglobin: 14.7 g/dL (ref 13.2–17.1)
MCH: 29.1 pg (ref 27.0–33.0)
MCHC: 33.7 g/dL (ref 32.0–36.0)
MCV: 86.2 fL (ref 80.0–100.0)
MPV: 11.4 fL (ref 7.5–12.5)
Monocytes Relative: 6.9 %
Neutro Abs: 6702 {cells}/uL (ref 1500–7800)
Neutrophils Relative %: 75.3 %
Platelets: 371 10*3/uL (ref 140–400)
RBC: 5.06 10*6/uL (ref 4.20–5.80)
RDW: 12.1 % (ref 11.0–15.0)
Total Lymphocyte: 13 %
WBC: 8.9 10*3/uL (ref 3.8–10.8)

## 2023-12-06 LAB — LIPID PANEL
Cholesterol: 160 mg/dL (ref <200)
HDL: 33 mg/dL — ABNORMAL LOW (ref >=40)
LDL Cholesterol (Calc): 97 mg/dL
Non-HDL Cholesterol (Calc): 127 mg/dL (ref <130)
Total CHOL/HDL Ratio: 4.8 (calc) (ref <5.0)
Triglycerides: 205 mg/dL — ABNORMAL HIGH (ref <150)

## 2023-12-06 LAB — MICROALBUMIN / CREATININE URINE RATIO
Creatinine, Urine: 109 mg/dL (ref 20–320)
Microalb Creat Ratio: 225 mg/g{creat} — ABNORMAL HIGH (ref <30)
Microalb, Ur: 24.5 mg/dL

## 2023-12-06 LAB — HEMOGLOBIN A1C
Hgb A1c MFr Bld: 8.3 %{Hb} — ABNORMAL HIGH (ref <5.7)
Mean Plasma Glucose: 192 mg/dL
eAG (mmol/L): 10.6 mmol/L

## 2023-12-06 LAB — HEPATITIS C ANTIBODY: Hepatitis C Ab: NONREACTIVE

## 2023-12-06 LAB — HIV ANTIBODY (ROUTINE TESTING W REFLEX): HIV 1&2 Ab, 4th Generation: NONREACTIVE

## 2023-12-08 ENCOUNTER — Other Ambulatory Visit: Payer: Self-pay | Admitting: Nurse Practitioner

## 2023-12-08 DIAGNOSIS — E1165 Type 2 diabetes mellitus with hyperglycemia: Secondary | ICD-10-CM

## 2023-12-08 MED ORDER — SEMAGLUTIDE (2 MG/DOSE) 8 MG/3ML ~~LOC~~ SOPN: 2.0000 mg | PEN_INJECTOR | SUBCUTANEOUS | 1 refills | Status: DC

## 2023-12-08 MED ORDER — DEXCOM G7 SENSOR MISC: 1.0000 | 11 refills | Status: DC

## 2023-12-10 ENCOUNTER — Other Ambulatory Visit: Payer: Self-pay

## 2023-12-10 ENCOUNTER — Other Ambulatory Visit: Payer: Self-pay | Admitting: Nurse Practitioner

## 2023-12-10 ENCOUNTER — Encounter: Payer: Self-pay | Admitting: Nurse Practitioner

## 2023-12-10 DIAGNOSIS — E1165 Type 2 diabetes mellitus with hyperglycemia: Secondary | ICD-10-CM

## 2023-12-10 MED ORDER — SEMAGLUTIDE (2 MG/DOSE) 8 MG/3ML ~~LOC~~ SOPN: 2.0000 mg | PEN_INJECTOR | SUBCUTANEOUS | 1 refills | Status: DC

## 2023-12-10 MED ORDER — INSULIN PEN NEEDLE 32G X 6 MM MISC: 1.0000 | Freq: Every day | 5 refills | Status: DC

## 2023-12-11 ENCOUNTER — Other Ambulatory Visit: Payer: Self-pay

## 2023-12-11 DIAGNOSIS — E1165 Type 2 diabetes mellitus with hyperglycemia: Secondary | ICD-10-CM

## 2023-12-11 MED ORDER — DEXCOM G7 SENSOR MISC: 1.0000 | 11 refills | Status: DC

## 2023-12-16 ENCOUNTER — Encounter: Payer: Self-pay | Admitting: Nurse Practitioner

## 2023-12-16 ENCOUNTER — Other Ambulatory Visit: Payer: Self-pay | Admitting: Nurse Practitioner

## 2023-12-16 DIAGNOSIS — E1165 Type 2 diabetes mellitus with hyperglycemia: Secondary | ICD-10-CM

## 2023-12-16 MED ORDER — DEXCOM G7 SENSOR MISC: 11 refills | Status: DC

## 2023-12-25 ENCOUNTER — Encounter: Payer: Self-pay | Admitting: Nurse Practitioner

## 2024-01-04 ENCOUNTER — Encounter: Payer: Self-pay | Admitting: Nurse Practitioner

## 2024-01-05 ENCOUNTER — Other Ambulatory Visit: Payer: Self-pay | Admitting: Nurse Practitioner

## 2024-01-22 ENCOUNTER — Other Ambulatory Visit: Payer: Self-pay

## 2024-01-22 DIAGNOSIS — I10 Essential (primary) hypertension: Secondary | ICD-10-CM

## 2024-01-22 MED ORDER — AMLODIPINE BESYLATE 10 MG PO TABS: 10.0000 mg | ORAL_TABLET | Freq: Every day | ORAL | 1 refills | Status: DC

## 2024-01-30 ENCOUNTER — Other Ambulatory Visit: Payer: Self-pay | Admitting: Nurse Practitioner

## 2024-01-30 DIAGNOSIS — E1165 Type 2 diabetes mellitus with hyperglycemia: Secondary | ICD-10-CM

## 2024-02-02 NOTE — Telephone Encounter (Signed)
Refused Ozempic because it's being requested too soon.  On 12/10/2023 6 ml, 1 refill was given.  Requested Prescriptions  Pending Prescriptions Disp Refills   OZEMPIC, 2 MG/DOSE, 8 MG/3ML SOPN [Pharmacy Med Name: OZEMPIC  2MG  SOLUTION PEN-INJECTOR  PEN DOSE] 9 mL 3    Sig: INJECT SUBCUTANEOUSLY 2 MG EVERY WEEK AS DIRECTED     Endocrinology:  Diabetes - GLP-1 Receptor Agonists - semaglutide Failed - 02/02/2024  8:56 AM      Failed - HBA1C in normal range and within 180 days    Hgb A1c MFr Bld  Date Value Ref Range Status  12/05/2023 8.3 (H) <5.7 % of total Hgb Final    Comment:    For someone without known diabetes, a hemoglobin A1c value of 6.5% or greater indicates that they may have  diabetes and this should be confirmed with a follow-up  test. . For someone with known diabetes, a value <7% indicates  that their diabetes is well controlled and a value  greater than or equal to 7% indicates suboptimal  control. A1c targets should be individualized based on  duration of diabetes, age, comorbid conditions, and  other considerations. . Currently, no consensus exists regarding use of hemoglobin A1c for diagnosis of diabetes for children. .          Passed - Cr in normal range and within 360 days    Creat  Date Value Ref Range Status  12/05/2023 0.81 0.70 - 1.30 mg/dL Final   Creatinine, Urine  Date Value Ref Range Status  12/05/2023 109 20 - 320 mg/dL Final         Passed - Valid encounter within last 6 months    Recent Outpatient Visits           1 month ago Primary hypertension    [facility] Berniece Salines, FNP       Future Appointments             In 2 months Zane Herald, Rudolpho Sevin, FNP [facility], [facility]

## 2024-02-27 ENCOUNTER — Encounter: Payer: Self-pay | Admitting: Nurse Practitioner

## 2024-02-27 DIAGNOSIS — E1165 Type 2 diabetes mellitus with hyperglycemia: Secondary | ICD-10-CM

## 2024-03-01 ENCOUNTER — Other Ambulatory Visit: Payer: Self-pay | Admitting: Nurse Practitioner

## 2024-03-01 DIAGNOSIS — E1165 Type 2 diabetes mellitus with hyperglycemia: Secondary | ICD-10-CM

## 2024-03-01 MED ORDER — SEMAGLUTIDE (2 MG/DOSE) 8 MG/3ML ~~LOC~~ SOPN: 2.0000 mg | PEN_INJECTOR | SUBCUTANEOUS | 1 refills | Status: DC

## 2024-03-01 NOTE — Addendum Note (Signed)
 Addended by: Davene Costain on: 03/01/2024 09:59 AM   Modules accepted: Orders

## 2024-03-26 LAB — HM DIABETES EYE EXAM

## 2024-04-02 NOTE — Progress Notes (Signed)
 BP 128/84 (Cuff Size: Large)   Pulse 77   Temp 98.2 F (36.8 C) (Oral)   Resp 16   Ht 5\' 10"  (1.778 m)   Wt 298 lb 3.2 oz (135.3 kg)   SpO2 97%   BMI 42.79 kg/m    Subjective:    Patient ID: Edward Gutierrez, male    DOB: January 18, 1966, 58 y.o.   MRN: [id number]  HPI: Edward Gutierrez is a 58 y.o. male  Chief Complaint  Patient presents with   Medical Management of Chronic Issues    Discussed the use of AI scribe software for clinical note transcription with the patient, who gave verbal consent to proceed.  History of Present Illness [name] "Edward Gutierrez" is a 58 year old male with type 2 diabetes who presents for diabetes management and follow-up.  He is here for follow-up on his type 2 diabetes management. His last A1c was 8.3%, and today it is 7.7%. Fasting blood sugars have been running higher than desired, around 145 mg/dL over the past month. He is currently taking Ozempic 2 mg weekly, Janumet 50/1000 mg twice daily, and Toujeo 45 units daily. He mentions experiencing a lot of work-related stress, which may be impacting his blood sugar control.  He has a history of hypertension and is currently taking amlodipine 10 mg daily and benazepril 40 mg daily. His blood pressure today is 128/84 mmHg, which is within the target range.  He also has hyperlipidemia and is on fenofibrate 135 mg daily.   He has a history of sleep apnea and morbid obesity, continue working on lifestyle modification.uses cpap every night.          04/05/2024    7:49 AM 12/05/2023    2:34 PM  Depression screen PHQ 2/9  Decreased Interest 0 2  Down, Depressed, Hopeless 0 0  PHQ - 2 Score 0 2  Altered sleeping  0  Tired, decreased energy  2  Change in appetite  0  Feeling bad or failure about yourself   0  Trouble concentrating  0  Moving slowly or fidgety/restless  0  Suicidal thoughts  0  PHQ-9 Score  4  Difficult doing work/chores  Not difficult at all    Relevant past medical,  surgical, family and social history reviewed and updated as indicated. Interim medical history since our last visit reviewed. Allergies and medications reviewed and updated.  Review of Systems  Constitutional: Negative for fever or weight change.  Respiratory: Negative for cough and shortness of breath.   Cardiovascular: Negative for chest pain or palpitations.  Gastrointestinal: Negative for abdominal pain, no bowel changes.  Musculoskeletal: Negative for gait problem or joint swelling.  Skin: Negative for rash.  Neurological: Negative for dizziness or headache.  No other specific complaints in a complete review of systems (except as listed in HPI above).      Objective:    BP 128/84 (Cuff Size: Large)   Pulse 77   Temp 98.2 F (36.8 C) (Oral)   Resp 16   Ht 5\' 10"  (1.778 m)   Wt 298 lb 3.2 oz (135.3 kg)   SpO2 97%   BMI 42.79 kg/m    Wt Readings from Last 3 Encounters:  04/05/24 298 lb 3.2 oz (135.3 kg)  12/05/23 (!) 301 lb 8 oz (136.8 kg)  03/03/21 300 lb (136.1 kg)    Physical Exam Physical Exam VITALS: BP- 128/84 GENERAL: Alert, cooperative, well developed, no acute distress. HEENT: Normocephalic, normal oropharynx,  moist mucous membranes. CHEST: Clear to auscultation bilaterally, no wheezes, rhonchi, or crackles. CARDIOVASCULAR: Normal heart rate and rhythm, S1 and S2 normal without murmurs. ABDOMEN: Soft, non-tender, non-distended, without organomegaly, normal bowel sounds. EXTREMITIES: No cyanosis or edema. NEUROLOGICAL: Cranial nerves grossly intact, moves all extremities without gross motor or sensory deficit.   Results for orders placed or performed in visit on 04/05/24  POCT HgB A1C   Collection Time: 04/05/24  7:55 AM  Result Value Ref Range   Hemoglobin A1C 7.7 (A) 4.0 - 5.6 %   HbA1c POC (<> result, manual entry)     HbA1c, POC (prediabetic range)     HbA1c, POC (controlled diabetic range)         Assessment & Plan:   Problem List Items  Addressed This Visit       Cardiovascular and Mediastinum   Hypertension - Primary   Relevant Medications   benazepril (LOTENSIN) 40 MG tablet     Respiratory   OSA (obstructive sleep apnea)     Endocrine   Type 2 diabetes mellitus (HCC)   Relevant Medications   sitaGLIPtin-metformin (JANUMET) 50-1000 MG tablet   benazepril (LOTENSIN) 40 MG tablet   Other Relevant Orders   POCT HgB A1C (Completed)     Other   Hyperlipemia   Relevant Medications   benazepril (LOTENSIN) 40 MG tablet   Morbid obesity (HCC)   Relevant Medications   sitaGLIPtin-metformin (JANUMET) 50-1000 MG tablet     Assessment and Plan Assessment & Plan Type 2 Diabetes Mellitus Type 2 diabetes with recent improvement in glycemic control. A1c decreased from 8.3% to 7.7%. Fasting blood glucose levels remain elevated at 145 mg/dL. Current medications include Ozempic, Janumet, and Toujeo. Plan to adjust insulin dosage to improve glycemic control. Emphasized maintaining A1c close to 7% to extend follow-up intervals to four months. - Increase Toujeo to 47 units daily for one week - Instruct him to send a message with blood sugar readings after one week - Schedule follow-up appointment in 4 months - Perform full blood panel at next appointment - Adjust follow-up frequency based on A1c levels  Morbid Obesity Morbid obesity contributes to other health conditions. Current management includes Ozempic for weight control and diabetes management.  Hypertension Hypertension is well-controlled with current medication regimen. Blood pressure today is 128/84 mmHg.  Hyperlipidemia Hyperlipidemia is managed with fenofibrate.  General Health Maintenance He is due for a pneumonia vaccination. Discussed the benefits of receiving the vaccine for protection beyond the current season. - Administer pneumonia vaccine        Follow up plan: Return in about 4 months (around 08/05/2024).

## 2024-04-05 ENCOUNTER — Other Ambulatory Visit: Payer: Self-pay

## 2024-04-05 ENCOUNTER — Encounter: Payer: Self-pay | Admitting: Nurse Practitioner

## 2024-04-05 ENCOUNTER — Ambulatory Visit: Payer: Self-pay | Admitting: Nurse Practitioner

## 2024-04-05 VITALS — BP 128/84 | HR 77 | Temp 98.2°F | Resp 16 | Ht 70.0 in | Wt 298.2 lb

## 2024-04-05 DIAGNOSIS — G4733 Obstructive sleep apnea (adult) (pediatric): Secondary | ICD-10-CM

## 2024-04-05 DIAGNOSIS — E782 Mixed hyperlipidemia: Secondary | ICD-10-CM | POA: Diagnosis not present

## 2024-04-05 DIAGNOSIS — Z23 Encounter for immunization: Secondary | ICD-10-CM

## 2024-04-05 DIAGNOSIS — I10 Essential (primary) hypertension: Secondary | ICD-10-CM | POA: Diagnosis not present

## 2024-04-05 DIAGNOSIS — E1165 Type 2 diabetes mellitus with hyperglycemia: Secondary | ICD-10-CM | POA: Diagnosis not present

## 2024-04-05 DIAGNOSIS — Z794 Long term (current) use of insulin: Secondary | ICD-10-CM | POA: Diagnosis not present

## 2024-04-05 LAB — POCT GLYCOSYLATED HEMOGLOBIN (HGB A1C): Hemoglobin A1C: 7.7 % — AB (ref 4.0–5.6)

## 2024-04-05 MED ORDER — JANUMET 50-1000 MG PO TABS: 1.0000 | ORAL_TABLET | Freq: Two times a day (BID) | ORAL | 1 refills | Status: DC

## 2024-04-05 MED ORDER — BENAZEPRIL HCL 40 MG PO TABS
40.0000 mg | ORAL_TABLET | Freq: Every day | ORAL | 1 refills | Status: DC
Start: 2024-04-05 — End: 2024-08-06

## 2024-04-05 NOTE — Addendum Note (Signed)
 Addended by: Davene Costain on: 04/05/2024 08:16 AM   Modules accepted: Orders

## 2024-05-11 ENCOUNTER — Other Ambulatory Visit: Payer: Self-pay | Admitting: Nurse Practitioner

## 2024-05-11 DIAGNOSIS — E782 Mixed hyperlipidemia: Secondary | ICD-10-CM

## 2024-05-13 NOTE — Telephone Encounter (Signed)
 Requested Prescriptions  Pending Prescriptions Disp Refills   Choline Fenofibrate  (FENOFIBRIC ACID ) 135 MG CPDR [Pharmacy Med Name: FENOFIBRIC  135MG   CAP  DELAYED RELEASE] 90 capsule 1    Sig: TAKE 1 CAPSULE BY MOUTH DAILY     Cardiovascular:  Antilipid - Fibric Acid Derivatives Failed - 05/13/2024 12:44 PM      Failed - Lipid Panel in normal range within the last 12 months    Cholesterol  Date Value Ref Range Status  12/05/2023 160 <200 mg/dL Final   LDL Cholesterol (Calc)  Date Value Ref Range Status  12/05/2023 97 mg/dL (calc) Final    Comment:    Reference range: <100 . Desirable range <100 mg/dL for primary prevention;   <70 mg/dL for patients with CHD or diabetic patients  with > or = 2 CHD risk factors. Aaron Aas LDL-C is now calculated using the Martin-Hopkins  calculation, which is a validated novel method providing  better accuracy than the Friedewald equation in the  estimation of LDL-C.  Melinda Sprawls et al. Erroll Heard. 0981;191(47): 2061-2068  (http://education.[employer].com/faq/FAQ164)    HDL  Date Value Ref Range Status  12/05/2023 33 (L) > OR = 40 mg/dL Final   Triglycerides  Date Value Ref Range Status  12/05/2023 205 (H) <150 mg/dL Final    Comment:    . If a non-fasting specimen was collected, consider repeat triglyceride testing on a fasting specimen if clinically indicated.  Imagene Mam et al. J. of Clin. Lipidol. 2015;9:129-169. Aaron Aas          Passed - ALT in normal range and within 360 days    ALT  Date Value Ref Range Status  12/05/2023 32 9 - 46 U/L Final         Passed - AST in normal range and within 360 days    AST  Date Value Ref Range Status  12/05/2023 22 10 - 35 U/L Final         Passed - Cr in normal range and within 360 days    Creat  Date Value Ref Range Status  12/05/2023 0.81 0.70 - 1.30 mg/dL Final   Creatinine, Urine  Date Value Ref Range Status  12/05/2023 109 20 - 320 mg/dL Final         Passed - HGB in normal range and  within 360 days    Hemoglobin  Date Value Ref Range Status  12/05/2023 14.7 13.2 - 17.1 g/dL Final         Passed - HCT in normal range and within 360 days    HCT  Date Value Ref Range Status  12/05/2023 43.6 38.5 - 50.0 % Final         Passed - PLT in normal range and within 360 days    Platelets  Date Value Ref Range Status  12/05/2023 371 140 - 400 Thousand/uL Final         Passed - WBC in normal range and within 360 days    WBC  Date Value Ref Range Status  12/05/2023 8.9 3.8 - 10.8 Thousand/uL Final         Passed - eGFR is 30 or above and within 360 days    GFR calc Af Amer  Date Value Ref Range Status  08/13/2017 >60 >60 mL/min Final    Comment:    (NOTE) The eGFR has been calculated using the CKD EPI equation. This calculation has not been validated in all clinical situations. eGFR's persistently <60 mL/min signify possible Chronic  Kidney Disease.    GFR calc non Af Amer  Date Value Ref Range Status  08/13/2017 >60 >60 mL/min Final   eGFR  Date Value Ref Range Status  12/05/2023 102 > OR = 60 mL/min/1.25m2 Final         Passed - Valid encounter within last 12 months    Recent Outpatient Visits           1 month ago Primary hypertension   [facility] [place], [place]

## 2024-05-25 ENCOUNTER — Telehealth: Payer: Self-pay | Admitting: Nurse Practitioner

## 2024-05-25 DIAGNOSIS — E1165 Type 2 diabetes mellitus with hyperglycemia: Secondary | ICD-10-CM

## 2024-05-25 MED ORDER — SEMAGLUTIDE (2 MG/DOSE) 8 MG/3ML ~~LOC~~ SOPN
2.0000 mg | PEN_INJECTOR | SUBCUTANEOUS | 1 refills | Status: DC
Start: 2024-05-25 — End: 2024-05-27

## 2024-05-25 NOTE — Telephone Encounter (Signed)
 TOUJEO SOLOSTAR 300 UNIT/ML SOPN

## 2024-05-26 ENCOUNTER — Encounter: Payer: Self-pay | Admitting: Nurse Practitioner

## 2024-05-26 DIAGNOSIS — E1165 Type 2 diabetes mellitus with hyperglycemia: Secondary | ICD-10-CM

## 2024-05-27 MED ORDER — SEMAGLUTIDE (2 MG/DOSE) 8 MG/3ML ~~LOC~~ SOPN: 2.0000 mg | PEN_INJECTOR | SUBCUTANEOUS | 1 refills | Status: DC

## 2024-05-27 MED ORDER — TOUJEO SOLOSTAR 300 UNIT/ML ~~LOC~~ SOPN: 47.0000 [IU] | PEN_INJECTOR | Freq: Every day | SUBCUTANEOUS | 1 refills | Status: DC

## 2024-06-08 ENCOUNTER — Other Ambulatory Visit: Payer: Self-pay | Admitting: Nurse Practitioner

## 2024-06-08 DIAGNOSIS — I10 Essential (primary) hypertension: Secondary | ICD-10-CM

## 2024-06-10 NOTE — Telephone Encounter (Signed)
 Requested Prescriptions  Pending Prescriptions Disp Refills   amLODipine  (NORVASC ) 10 MG tablet [Pharmacy Med Name: amLODIPine  Besylate 10 MG Oral Tablet] 90 tablet 1    Sig: TAKE 1 TABLET BY MOUTH DAILY     Cardiovascular: Calcium Channel Blockers 2 Passed - 06/10/2024 11:44 AM      Passed - Last BP in normal range    BP Readings from Last 1 Encounters:  04/05/24 128/84         Passed - Last Heart Rate in normal range    Pulse Readings from Last 1 Encounters:  04/05/24 77         Passed - Valid encounter within last 6 months    Recent Outpatient Visits           2 months ago Primary hypertension   [facility] [place], [place]

## 2024-06-22 ENCOUNTER — Other Ambulatory Visit: Payer: Self-pay | Admitting: Nurse Practitioner

## 2024-06-24 NOTE — Telephone Encounter (Signed)
 Requested Prescriptions  Refused Prescriptions Disp Refills   TOUJEO  SOLOSTAR 300 UNIT/ML Solostar Pen [Pharmacy Med Name: Toujeo  SoloStar 300 UNIT/ML Subcutaneous Solution Pen-injector] 9 mL 5    Sig: INJECT SUBCUTANEOUSLY 47 UNITS  DAILY     Endocrinology:  Diabetes - Insulins Passed - 06/24/2024  1:38 PM      Passed - HBA1C is between 0 and 7.9 and within 180 days    Hemoglobin A1C  Date Value Ref Range Status  04/05/2024 7.7 (A) 4.0 - 5.6 % Final   Hgb A1c MFr Bld  Date Value Ref Range Status  12/05/2023 8.3 (H) <5.7 % of total Hgb Final    Comment:    For someone without known diabetes, a hemoglobin A1c value of 6.5% or greater indicates that they may have  diabetes and this should be confirmed with a follow-up  test. . For someone with known diabetes, a value <7% indicates  that their diabetes is well controlled and a value  greater than or equal to 7% indicates suboptimal  control. A1c targets should be individualized based on  duration of diabetes, age, comorbid conditions, and  other considerations. . Currently, no consensus exists regarding use of hemoglobin A1c for diagnosis of diabetes for children. SABRA Amy - Valid encounter within last 6 months    Recent Outpatient Visits           2 months ago Primary hypertension   [facility] Mliss [place], [place]

## 2024-07-14 ENCOUNTER — Other Ambulatory Visit: Payer: Self-pay | Admitting: Nurse Practitioner

## 2024-07-30 ENCOUNTER — Other Ambulatory Visit: Payer: Self-pay | Admitting: Medical Genetics

## 2024-08-06 ENCOUNTER — Encounter: Payer: Self-pay | Admitting: Nurse Practitioner

## 2024-08-06 ENCOUNTER — Other Ambulatory Visit: Payer: Self-pay

## 2024-08-06 ENCOUNTER — Ambulatory Visit: Admitting: Nurse Practitioner

## 2024-08-06 VITALS — BP 134/82 | HR 70 | Temp 98.1°F | Resp 16 | Ht 70.0 in | Wt 300.2 lb

## 2024-08-06 DIAGNOSIS — E1165 Type 2 diabetes mellitus with hyperglycemia: Secondary | ICD-10-CM

## 2024-08-06 DIAGNOSIS — G4733 Obstructive sleep apnea (adult) (pediatric): Secondary | ICD-10-CM

## 2024-08-06 DIAGNOSIS — E782 Mixed hyperlipidemia: Secondary | ICD-10-CM | POA: Diagnosis not present

## 2024-08-06 DIAGNOSIS — Z1211 Encounter for screening for malignant neoplasm of colon: Secondary | ICD-10-CM

## 2024-08-06 DIAGNOSIS — Z794 Long term (current) use of insulin: Secondary | ICD-10-CM

## 2024-08-06 DIAGNOSIS — I10 Essential (primary) hypertension: Secondary | ICD-10-CM

## 2024-08-06 MED ORDER — TIRZEPATIDE 12.5 MG/0.5ML ~~LOC~~ SOAJ
12.5000 mg | SUBCUTANEOUS | 3 refills | Status: AC
Start: 2024-08-06 — End: ?

## 2024-08-06 MED ORDER — DEXCOM G7 SENSOR MISC: 11 refills | Status: AC

## 2024-08-06 MED ORDER — JANUMET 50-1000 MG PO TABS: 1.0000 | ORAL_TABLET | Freq: Two times a day (BID) | ORAL | 3 refills | Status: AC

## 2024-08-06 MED ORDER — SEMAGLUTIDE (2 MG/DOSE) 8 MG/3ML ~~LOC~~ SOPN
2.0000 mg | PEN_INJECTOR | SUBCUTANEOUS | 3 refills | Status: DC
Start: 2024-08-06 — End: 2024-08-06

## 2024-08-06 MED ORDER — BENAZEPRIL HCL 40 MG PO TABS: 40.0000 mg | ORAL_TABLET | Freq: Every day | ORAL | 3 refills | Status: AC

## 2024-08-06 MED ORDER — FENOFIBRIC ACID 135 MG PO CPDR: 1.0000 | DELAYED_RELEASE_CAPSULE | Freq: Every day | ORAL | 3 refills | Status: AC

## 2024-08-06 MED ORDER — INSULIN PEN NEEDLE 32G X 6 MM MISC
1.0000 | Freq: Every day | 5 refills | Status: AC
Start: 2024-08-06 — End: ?

## 2024-08-06 MED ORDER — AMLODIPINE BESYLATE 10 MG PO TABS: 10.0000 mg | ORAL_TABLET | Freq: Every day | ORAL | 3 refills | Status: AC

## 2024-08-06 NOTE — Progress Notes (Signed)
 BP 134/82 (Cuff Size: Large)   Temp 98.1 F (36.7 C) (Oral)   Resp 16   Ht 5' 10 (1.778 m)   Wt (!) 300 lb 3.2 oz (136.2 kg)   BMI 43.07 kg/m    Subjective:    Patient ID: Edward Gutierrez Breed, male    DOB: [birth year], 58 y.o.   MRN: [id number]  HPI: Edward Gutierrez is a 58 y.o. male  Chief Complaint  Patient presents with   Medical Management of Chronic Issues    4 month recheck    Discussed the use of AI scribe software for clinical note transcription with the patient, who gave verbal consent to proceed.  History of Present Illness Edward Gutierrez is a 58 year old male with hypertension, type two diabetes, hyperlipidemia, and morbid obesity who presents for a formal follow-up.  Glycemic control - Elevated blood glucose levels despite adherence to medication regimen, with occasional missed doses - Continuous glucose monitoring with Dexcom; two sensor failures in the past month - Last hemoglobin A1c was 7.7% - Current diabetes medications: Ozempic  2 mg weekly (on for 2 years, last year at 2 mg dose, minimal change in condition), Janumet  50/1000 mg twice daily, Toujeo  47 units daily  Hypertension - Current antihypertensive medications: amlodipine  10 mg daily, benazepril  40 mg daily - No new symptoms or changes in blood pressure control reported  Hyperlipidemia - Current medication: fenofibrate 135 mg daily - No new symptoms or changes in lipid status reported  Morbid obesity and physical activity limitation - Weight: 300 pounds - BMI: 43.07 - Difficulty engaging in outdoor activities due to summer rain and heat, resulting in feeling confined indoors  General health status - No new symptoms or changes in overall health status - Continued use of prescribed medications and monitoring devices     Waist Measurement : 51 inches  Body mass index is 43.07 kg/m.  Filed Weights   08/06/24 0740  Weight: (!) 300 lb 3.2 oz (136.2 kg)       04/05/2024     7:49 AM 12/05/2023    2:34 PM  Depression screen PHQ 2/9  Decreased Interest 0 2  Down, Depressed, Hopeless 0 0  PHQ - 2 Score 0 2  Altered sleeping  0  Tired, decreased energy  2  Change in appetite  0  Feeling bad or failure about yourself   0  Trouble concentrating  0  Moving slowly or fidgety/restless  0  Suicidal thoughts  0  PHQ-9 Score  4  Difficult doing work/chores  Not difficult at all    Relevant past medical, surgical, family and social history reviewed and updated as indicated. Interim medical history since our last visit reviewed. Allergies and medications reviewed and updated.  Review of Systems  Constitutional: Negative for fever or weight change.  Respiratory: Negative for cough and shortness of breath.   Cardiovascular: Negative for chest pain or palpitations.  Gastrointestinal: Negative for abdominal pain, no bowel changes.  Musculoskeletal: Negative for gait problem or joint swelling.  Skin: Negative for rash.  Neurological: Negative for dizziness or headache.  No other specific complaints in a complete review of systems (except as listed in HPI above).      Objective:     BP 134/82 (Cuff Size: Large)   Temp 98.1 F (36.7 C) (Oral)   Resp 16   Ht 5' 10 (1.778 m)   Wt (!) 300 lb 3.2 oz (136.2 kg)   BMI 43.07 kg/m  Wt Readings from Last 3 Encounters:  08/06/24 (!) 300 lb 3.2 oz (136.2 kg)  04/05/24 298 lb 3.2 oz (135.3 kg)  12/05/23 (!) 301 lb 8 oz (136.8 kg)    Physical Exam Physical Exam VITALS: BP- 134/82 MEASUREMENTS: Weight- 300, BMI- 43.07. GENERAL: Alert, cooperative, well developed, no acute distress HEENT: Normocephalic, normal oropharynx, moist mucous membranes CHEST: Clear to auscultation bilaterally, no wheezes, rhonchi, or crackles CARDIOVASCULAR: Normal heart rate and rhythm, S1 and S2 normal without murmurs ABDOMEN: Soft, non-tender, non-distended, without organomegaly, normal bowel sounds EXTREMITIES: No cyanosis or  edema NEUROLOGICAL: Cranial nerves grossly intact, moves all extremities without gross motor or sensory deficit   Results for orders placed or performed in visit on 04/05/24  POCT HgB A1C   Collection Time: 04/05/24  7:55 AM  Result Value Ref Range   Hemoglobin A1C 7.7 (A) 4.0 - 5.6 %   HbA1c POC (<> result, manual entry)     HbA1c, POC (prediabetic range)     HbA1c, POC (controlled diabetic range)            Assessment & Plan:   Problem List Items Addressed This Visit       Cardiovascular and Mediastinum   Hypertension - Primary   Relevant Medications   benazepril  (LOTENSIN ) 40 MG tablet   Choline Fenofibrate  (FENOFIBRIC ACID ) 135 MG CPDR   amLODipine  (NORVASC ) 10 MG tablet   Other Relevant Orders   CBC with Differential/Platelet   Comprehensive metabolic panel with GFR     Respiratory   OSA (obstructive sleep apnea)     Endocrine   Type 2 diabetes mellitus (HCC)   Relevant Medications   benazepril  (LOTENSIN ) 40 MG tablet   sitaGLIPtin-metformin (JANUMET ) 50-1000 MG tablet   Continuous Glucose Sensor (DEXCOM G7 SENSOR) MISC   Insulin  Pen Needle 32G X 6 MM MISC   tirzepatide  (MOUNJARO ) 12.5 MG/0.5ML Pen   Other Relevant Orders   Comprehensive metabolic panel with GFR   Hemoglobin A1c     Other   Hyperlipemia   Relevant Medications   benazepril  (LOTENSIN ) 40 MG tablet   Choline Fenofibrate  (FENOFIBRIC ACID ) 135 MG CPDR   amLODipine  (NORVASC ) 10 MG tablet   Other Relevant Orders   Lipid panel   Morbid obesity (HCC)   Relevant Medications   sitaGLIPtin-metformin (JANUMET ) 50-1000 MG tablet   tirzepatide  (MOUNJARO ) 12.5 MG/0.5ML Pen     Assessment and Plan Assessment & Plan Type 2 diabetes mellitus Suboptimal control with recent A1c of 7.7. Blood sugars remain elevated despite current regimen of Ozempic  2 mg weekly, Janumet  50/1000 mg twice daily, and Toujeo  47 units daily. Ozempic  has been used for two years with minimal change in glycemic control and no  side effects. Mounjaro , a dual receptor GLP-1 agonist, may offer improved efficacy. - Switch from Ozempic  to Mounjaro  12.5 mg. - Monitor tolerance to Mounjaro  and report any issues. - Continue Janumet  and Toujeo .  Hypertension Amlodipine  10 mg daily and benazepril  40 mg daily. Recent blood pressure reading of 134/82 mmHg.  Morbid obesity BMI of 43.07. Weight management is a concern. Current medication Ozempic  is being switched to Mounjaro  for potential weight management benefits. - Switch to Mounjaro  12.5 mg for potential weight management benefits.  Hyperlipidemia Fenofibrate 135 mg daily.        Follow up plan: Return in about 4 months (around 12/06/2024) for follow up.

## 2024-08-07 LAB — COMPREHENSIVE METABOLIC PANEL WITH GFR
AG Ratio: 2.1 (calc) (ref 1.0–2.5)
ALT: 27 U/L (ref 9–46)
AST: 20 U/L (ref 10–35)
Albumin: 4.6 g/dL (ref 3.6–5.1)
Alkaline phosphatase (APISO): 47 U/L (ref 35–144)
BUN: 14 mg/dL (ref 7–25)
CO2: 26 mmol/L (ref 20–32)
Calcium: 9.5 mg/dL (ref 8.6–10.3)
Chloride: 102 mmol/L (ref 98–110)
Creat: 0.79 mg/dL (ref 0.70–1.30)
Globulin: 2.2 g/dL (ref 1.9–3.7)
Glucose, Bld: 148 mg/dL — ABNORMAL HIGH (ref 65–99)
Potassium: 4.3 mmol/L (ref 3.5–5.3)
Sodium: 137 mmol/L (ref 135–146)
Total Bilirubin: 0.4 mg/dL (ref 0.2–1.2)
Total Protein: 6.8 g/dL (ref 6.1–8.1)
eGFR: 102 mL/min/1.73m2 (ref >=60)

## 2024-08-07 LAB — CBC WITH DIFFERENTIAL/PLATELET
Absolute Lymphocytes: 1035 {cells}/uL (ref 850–3900)
Absolute Monocytes: 615 {cells}/uL (ref 200–950)
Basophils Absolute: 90 {cells}/uL (ref 0–200)
Basophils Relative: 1.2 %
Eosinophils Absolute: 300 {cells}/uL (ref 15–500)
Eosinophils Relative: 4 %
HCT: 41.3 % (ref 38.5–50.0)
Hemoglobin: 13.6 g/dL (ref 13.2–17.1)
MCH: 29.2 pg (ref 27.0–33.0)
MCHC: 32.9 g/dL (ref 32.0–36.0)
MCV: 88.8 fL (ref 80.0–100.0)
MPV: 11.1 fL (ref 7.5–12.5)
Monocytes Relative: 8.2 %
Neutro Abs: 5460 {cells}/uL (ref 1500–7800)
Neutrophils Relative %: 72.8 %
Platelets: 356 Thousand/uL (ref 140–400)
RBC: 4.65 Million/uL (ref 4.20–5.80)
RDW: 12.7 % (ref 11.0–15.0)
Total Lymphocyte: 13.8 %
WBC: 7.5 Thousand/uL (ref 3.8–10.8)

## 2024-08-07 LAB — LIPID PANEL
Cholesterol: 152 mg/dL (ref <200)
HDL: 37 mg/dL — ABNORMAL LOW (ref >=40)
LDL Cholesterol (Calc): 92 mg/dL
Non-HDL Cholesterol (Calc): 115 mg/dL (ref <130)
Total CHOL/HDL Ratio: 4.1 (calc) (ref <5.0)
Triglycerides: 133 mg/dL (ref <150)

## 2024-08-07 LAB — HEMOGLOBIN A1C
Hgb A1c MFr Bld: 7.5 % — ABNORMAL HIGH (ref <5.7)
Mean Plasma Glucose: 169 mg/dL
eAG (mmol/L): 9.3 mmol/L

## 2024-08-09 ENCOUNTER — Telehealth: Payer: Self-pay

## 2024-08-09 ENCOUNTER — Ambulatory Visit: Payer: Self-pay | Admitting: Nurse Practitioner

## 2024-08-09 ENCOUNTER — Other Ambulatory Visit: Payer: Self-pay

## 2024-08-09 DIAGNOSIS — Z860100 Personal history of colon polyps, unspecified: Secondary | ICD-10-CM

## 2024-08-09 MED ORDER — NA SULFATE-K SULFATE-MG SULF 17.5-3.13-1.6 GM/177ML PO SOLN: 1.0000 | Freq: Once | ORAL | 0 refills | Status: AC

## 2024-08-09 NOTE — Telephone Encounter (Signed)
 Gastroenterology Pre-Procedure Review  Request Date: 11/17/24 Requesting Physician: Dr. Marinda  PATIENT REVIEW QUESTIONS: The patient responded to the following health history questions as indicated:    1. Are you having any GI issues? no 2. Do you have a personal history of Polyps? yes (last colonoscopy performed by Dr. Dessa 06/17/18  tubular adenoma noted) 3. Do you have a family history of Colon Cancer or Polyps? no 4. Diabetes Mellitus? yes (pt advised on stops for Janumet , Toujeo  and Ozempic , Mounjaro  noted on instructions) 5. Joint replacements in the past 12 months?no 6. Major health problems in the past 3 months?no 7. Any artificial heart valves, MVP, or defibrillator?no    MEDICATIONS & ALLERGIES:    Patient reports the following regarding taking any anticoagulation/antiplatelet therapy:   Plavix, Coumadin, Eliquis, Xarelto, Lovenox, Pradaxa, Brilinta, or Effient? no Aspirin? no  Patient confirms/reports the following medications:  Current Outpatient Medications  Medication Sig Dispense Refill   amLODipine  (NORVASC ) 10 MG tablet Take 1 tablet (10 mg total) by mouth daily. 90 tablet 3   benazepril  (LOTENSIN ) 40 MG tablet Take 1 tablet (40 mg total) by mouth daily. 90 tablet 3   Choline Fenofibrate  (FENOFIBRIC ACID ) 135 MG CPDR Take 1 capsule by mouth daily. 90 capsule 3   Continuous Glucose Sensor (DEXCOM G7 SENSOR) MISC Every 10 days 3 each 11   Insulin  Pen Needle 32G X 6 MM MISC 1 each by Does not apply route daily at 6 (six) AM. 100 each 5   Omega-3 1000 MG CAPS Take by mouth daily.     sitaGLIPtin-metformin (JANUMET ) 50-1000 MG tablet Take 1 tablet by mouth 2 (two) times daily. 180 tablet 3   tirzepatide  (MOUNJARO ) 12.5 MG/0.5ML Pen Inject 12.5 mg into the skin once a week. 6 mL 3   TOUJEO  SOLOSTAR 300 UNIT/ML Solostar Pen INJECT SUBCUTANEOUSLY 47 UNITS  DAILY 6 mL 1   No current facility-administered medications for this visit.    Patient confirms/reports the  following allergies:  Allergies  Allergen Reactions   Codeine Nausea And Vomiting    No orders of the defined types were placed in this encounter.   AUTHORIZATION INFORMATION Primary Insurance: 1D#: Group #:  Secondary Insurance: 1D#: Group #:  SCHEDULE INFORMATION: Date: 11/17/24 Time: Location: [facility]

## 2024-08-10 ENCOUNTER — Other Ambulatory Visit: Payer: Self-pay | Admitting: Nurse Practitioner

## 2024-08-13 NOTE — Telephone Encounter (Signed)
 Requested Prescriptions  Refused Prescriptions Disp Refills   TOUJEO  SOLOSTAR 300 UNIT/ML Solostar Pen [Pharmacy Med Name: Toujeo  SoloStar 300 UNIT/ML Subcutaneous Solution Pen-injector] 9 mL 5    Sig: INJECT SUBCUTANEOUSLY 47 UNITS  DAILY     Endocrinology:  Diabetes - Insulins Passed - 08/13/2024 12:33 PM      Passed - HBA1C is between 0 and 7.9 and within 180 days    Hgb A1c MFr Bld  Date Value Ref Range Status  08/06/2024 7.5 (H) <5.7 % Final    Comment:    For someone without known diabetes, a hemoglobin A1c value of 6.5% or greater indicates that they may have  diabetes and this should be confirmed with a follow-up  test. . For someone with known diabetes, a value <7% indicates  that their diabetes is well controlled and a value  greater than or equal to 7% indicates suboptimal  control. A1c targets should be individualized based on  duration of diabetes, age, comorbid conditions, and  other considerations. . Currently, no consensus exists regarding use of hemoglobin A1c for diagnosis of diabetes for children. SABRA Amy - Valid encounter within last 6 months    Recent Outpatient Visits           1 week ago Primary hypertension   [facility] Gareth Mliss FALCON, FNP   4 months ago Primary hypertension   [facility] Gareth Mliss FALCON, [place]

## 2024-08-25 ENCOUNTER — Other Ambulatory Visit: Payer: Self-pay | Admitting: Nurse Practitioner

## 2024-08-27 NOTE — Telephone Encounter (Signed)
 Requested Prescriptions  Pending Prescriptions Disp Refills   TOUJEO  SOLOSTAR 300 UNIT/ML Solostar Pen [Pharmacy Med Name: Toujeo  SoloStar 300 UNIT/ML Subcutaneous Solution Pen-injector] 9 mL 3    Sig: INJECT SUBCUTANEOUSLY 47 UNITS  DAILY     Endocrinology:  Diabetes - Insulins Passed - 08/27/2024  1:35 PM      Passed - HBA1C is between 0 and 7.9 and within 180 days    Hgb A1c MFr Bld  Date Value Ref Range Status  08/06/2024 7.5 (H) <5.7 % Final    Comment:    For someone without known diabetes, a hemoglobin A1c value of 6.5% or greater indicates that they may have  diabetes and this should be confirmed with a follow-up  test. . For someone with known diabetes, a value <7% indicates  that their diabetes is well controlled and a value  greater than or equal to 7% indicates suboptimal  control. A1c targets should be individualized based on  duration of diabetes, age, comorbid conditions, and  other considerations. . Currently, no consensus exists regarding use of hemoglobin A1c for diagnosis of diabetes for children. SABRA Amy - Valid encounter within last 6 months    Recent Outpatient Visits           3 weeks ago Primary hypertension   [facility] Gareth Mliss FALCON, FNP   4 months ago Primary hypertension   [facility] Gareth Mliss FALCON, [place]

## 2024-09-28 ENCOUNTER — Other Ambulatory Visit
Admission: RE | Admit: 2024-09-28 | Discharge: 2024-09-28 | Disposition: A | Discharge status: Home / Self Care | Payer: Self-pay | Source: Ambulatory Visit | Attending: Medical Genetics | Admitting: Medical Genetics

## 2024-10-08 LAB — GENECONNECT MOLECULAR SCREEN: Genetic Analysis Overall Interpretation: NEGATIVE

## 2024-10-21 ENCOUNTER — Ambulatory Visit: Admitting: Podiatry

## 2024-10-21 DIAGNOSIS — L989 Disorder of the skin and subcutaneous tissue, unspecified: Secondary | ICD-10-CM | POA: Diagnosis not present

## 2024-10-21 NOTE — Progress Notes (Signed)
 Subjective:  Patient ID: Edward Gutierrez, male    DOB: 10-03-1966,  MRN: [id number]  Chief Complaint  Patient presents with   Foot Pain    Pt stated that he has this place in the ball of his foot that is starting to cause him some pain     58 y.o. male presents with the above complaint.  Patient presents with right submetatarsal 2 porokeratotic lesion painful to touch is progressive and worse worse with ambulation or shoe pressure patient would like to discuss treatment options for this.  He has not seen anyone as part of seeing me.  Denies any other acute complaints pain scale 7 out of 10 dull aching nature   Review of Systems: Negative except as noted in the HPI. Denies N/V/F/Ch.  Past Medical History:  Diagnosis Date   Diabetes mellitus without complication (HCC)    Diverticulitis 2018   Hyperlipidemia    Hypertension    Joint pain    Sleep apnea    c pap    Current Outpatient Medications:    amLODipine  (NORVASC ) 10 MG tablet, Take 1 tablet (10 mg total) by mouth daily., Disp: 90 tablet, Rfl: 3   benazepril  (LOTENSIN ) 40 MG tablet, Take 1 tablet (40 mg total) by mouth daily., Disp: 90 tablet, Rfl: 3   Choline Fenofibrate  (FENOFIBRIC ACID ) 135 MG CPDR, Take 1 capsule by mouth daily., Disp: 90 capsule, Rfl: 3   Continuous Glucose Sensor (DEXCOM G7 SENSOR) MISC, Every 10 days, Disp: 3 each, Rfl: 11   Insulin  Pen Needle 32G X 6 MM MISC, 1 each by Does not apply route daily at 6 (six) AM., Disp: 100 each, Rfl: 5   Omega-3 1000 MG CAPS, Take by mouth daily., Disp: , Rfl:    sitaGLIPtin-metformin (JANUMET ) 50-1000 MG tablet, Take 1 tablet by mouth 2 (two) times daily., Disp: 180 tablet, Rfl: 3   tirzepatide  (MOUNJARO ) 12.5 MG/0.5ML Pen, Inject 12.5 mg into the skin once a week., Disp: 6 mL, Rfl: 3   TOUJEO  SOLOSTAR 300 UNIT/ML Solostar Pen, INJECT SUBCUTANEOUSLY 47 UNITS  DAILY, Disp: 9 mL, Rfl: 3  Social History   Tobacco Use  Smoking Status Former   Current packs/day: 0.00    Types: Cigarettes   Quit date: 12/30/2008   Years since quitting: 15.8  Smokeless Tobacco Never    Allergies  Allergen Reactions   Codeine Nausea And Vomiting   Objective:  There were no vitals filed for this visit. There is no height or weight on file to calculate BMI. Constitutional Well developed. Well nourished.  Vascular Dorsalis pedis pulses palpable bilaterally. Posterior tibial pulses palpable bilaterally. Capillary refill normal to all digits.  No cyanosis or clubbing noted. Pedal hair growth normal.  Neurologic Normal speech. Oriented to person, place, and time. Epicritic sensation to light touch grossly present bilaterally.  Dermatologic Right submetatarsal 2 porokeratotic lesion painful to touch central nucleated core noted.  No open wounds noted.  No pinpoint bleeding noted  Orthopedic: Normal joint ROM without pain or crepitus bilaterally. No visible deformities. No bony tenderness.   Radiographs: None Assessment:   1. Benign skin lesion    Plan:  Patient was evaluated and treated and all questions answered.  Right submetatarsal 2 benign skin lesion --Lesion was debrided today without complications. Hemostasis was achieved and the area was cleaned. Cantharone was applied followed by an occlusive bandage. Post procedure complications were discussed. Monitor for signs or symptoms of infection and directed to call the office mainly should  any occur.   No follow-ups on file.

## 2024-10-28 ENCOUNTER — Encounter: Payer: Self-pay | Admitting: Podiatry

## 2024-11-02 ENCOUNTER — Ambulatory Visit: Admitting: Podiatry

## 2024-11-02 DIAGNOSIS — L989 Disorder of the skin and subcutaneous tissue, unspecified: Secondary | ICD-10-CM

## 2024-11-02 NOTE — Progress Notes (Signed)
  Subjective:  Patient ID: Edward Gutierrez, male    DOB: 08-Aug-1966,  MRN: [id number]  Chief Complaint  Patient presents with   Benign skin lesion    Follow up for Benign skin lesion Pt stated that things are going okay      58 y.o. male presents with the above complaint.  Patient presents for follow-up right submetatarsal 2 porokeratotic lesion he had a blister like sensation he popped it at home he had good relief since then denies any other acute complaints   Review of Systems: Negative except as noted in the HPI. Denies N/V/F/Ch.  Past Medical History:  Diagnosis Date   Diabetes mellitus without complication (HCC)    Diverticulitis 2018   Hyperlipidemia    Hypertension    Joint pain    Sleep apnea    c pap    Current Outpatient Medications:    amLODipine  (NORVASC ) 10 MG tablet, Take 1 tablet (10 mg total) by mouth daily., Disp: 90 tablet, Rfl: 3   benazepril  (LOTENSIN ) 40 MG tablet, Take 1 tablet (40 mg total) by mouth daily., Disp: 90 tablet, Rfl: 3   Choline Fenofibrate  (FENOFIBRIC ACID ) 135 MG CPDR, Take 1 capsule by mouth daily., Disp: 90 capsule, Rfl: 3   Continuous Glucose Sensor (DEXCOM G7 SENSOR) MISC, Every 10 days, Disp: 3 each, Rfl: 11   Insulin  Pen Needle 32G X 6 MM MISC, 1 each by Does not apply route daily at 6 (six) AM., Disp: 100 each, Rfl: 5   Omega-3 1000 MG CAPS, Take by mouth daily., Disp: , Rfl:    sitaGLIPtin-metformin (JANUMET ) 50-1000 MG tablet, Take 1 tablet by mouth 2 (two) times daily., Disp: 180 tablet, Rfl: 3   tirzepatide  (MOUNJARO ) 12.5 MG/0.5ML Pen, Inject 12.5 mg into the skin once a week., Disp: 6 mL, Rfl: 3   TOUJEO  SOLOSTAR 300 UNIT/ML Solostar Pen, INJECT SUBCUTANEOUSLY 47 UNITS  DAILY, Disp: 9 mL, Rfl: 3  Social History   Tobacco Use  Smoking Status Former   Current packs/day: 0.00   Types: Cigarettes   Quit date: 12/30/2008   Years since quitting: 15.8  Smokeless Tobacco Never    Allergies  Allergen Reactions   Codeine  Nausea And Vomiting   Objective:  There were no vitals filed for this visit. There is no height or weight on file to calculate BMI. Constitutional Well developed. Well nourished.  Vascular Dorsalis pedis pulses palpable bilaterally. Posterior tibial pulses palpable bilaterally. Capillary refill normal to all digits.  No cyanosis or clubbing noted. Pedal hair growth normal.  Neurologic Normal speech. Oriented to person, place, and time. Epicritic sensation to light touch grossly present bilaterally.  Dermatologic No further porokeratotic lesion/benign skin lesion noted to right submetatarsal 2.  Blister was skin was removed.  No complication noted good healing skin underneath  Orthopedic: Normal joint ROM without pain or crepitus bilaterally. No visible deformities. No bony tenderness.   Radiographs: None Assessment:   No diagnosis found.  Plan:  Patient was evaluated and treated and all questions answered.  Right submetatarsal 2 benign skin lesion -- Clinically healed and officially discharged from my care if any foot and ankle issues arise in the future he will come back and see me.  At this time no further skin lesion noted.  No follow-ups on file.

## 2024-11-17 ENCOUNTER — Ambulatory Visit

## 2024-11-17 ENCOUNTER — Encounter
Admission: RE | Disposition: A | Discharge status: Home / Self Care | Payer: Self-pay | Source: Home / Self Care | Attending: General Surgery

## 2024-11-17 ENCOUNTER — Ambulatory Visit
Admission: RE | Admit: 2024-11-17 | Discharge: 2024-11-17 | Disposition: A | Discharge status: Home / Self Care | Attending: General Surgery | Admitting: General Surgery

## 2024-11-17 ENCOUNTER — Encounter: Payer: Self-pay | Admitting: General Surgery

## 2024-11-17 DIAGNOSIS — Z794 Long term (current) use of insulin: Secondary | ICD-10-CM | POA: Insufficient documentation

## 2024-11-17 DIAGNOSIS — E119 Type 2 diabetes mellitus without complications: Secondary | ICD-10-CM | POA: Diagnosis not present

## 2024-11-17 DIAGNOSIS — Z7984 Long term (current) use of oral hypoglycemic drugs: Secondary | ICD-10-CM | POA: Insufficient documentation

## 2024-11-17 DIAGNOSIS — D124 Benign neoplasm of descending colon: Secondary | ICD-10-CM | POA: Insufficient documentation

## 2024-11-17 DIAGNOSIS — Z1211 Encounter for screening for malignant neoplasm of colon: Secondary | ICD-10-CM | POA: Insufficient documentation

## 2024-11-17 DIAGNOSIS — Z860100 Personal history of colon polyps, unspecified: Secondary | ICD-10-CM

## 2024-11-17 DIAGNOSIS — D179 Benign lipomatous neoplasm, unspecified: Secondary | ICD-10-CM | POA: Insufficient documentation

## 2024-11-17 DIAGNOSIS — I10 Essential (primary) hypertension: Secondary | ICD-10-CM | POA: Insufficient documentation

## 2024-11-17 DIAGNOSIS — G4730 Sleep apnea, unspecified: Secondary | ICD-10-CM | POA: Insufficient documentation

## 2024-11-17 DIAGNOSIS — Z87891 Personal history of nicotine dependence: Secondary | ICD-10-CM | POA: Diagnosis not present

## 2024-11-17 DIAGNOSIS — K5730 Diverticulosis of large intestine without perforation or abscess without bleeding: Secondary | ICD-10-CM | POA: Insufficient documentation

## 2024-11-17 DIAGNOSIS — Z7985 Long-term (current) use of injectable non-insulin antidiabetic drugs: Secondary | ICD-10-CM | POA: Diagnosis not present

## 2024-11-17 DIAGNOSIS — D123 Benign neoplasm of transverse colon: Secondary | ICD-10-CM | POA: Diagnosis not present

## 2024-11-17 DIAGNOSIS — D175 Benign lipomatous neoplasm of intra-abdominal organs: Secondary | ICD-10-CM

## 2024-11-17 HISTORY — PX: COLONOSCOPY: SHX5424

## 2024-11-17 HISTORY — PX: POLYPECTOMY: SHX149

## 2024-11-17 HISTORY — PX: HEMOSTASIS CLIP PLACEMENT: SHX6857

## 2024-11-17 LAB — GLUCOSE, CAPILLARY: Glucose-Capillary: 135 mg/dL — ABNORMAL HIGH (ref 70–99)

## 2024-11-17 SURGERY — COLONOSCOPY
Anesthesia: General

## 2024-11-17 MED ORDER — PROPOFOL 10 MG/ML IV BOLUS
INTRAVENOUS | Status: DC | PRN
Start: 2024-11-17 — End: 2024-11-17
  Administered 2024-11-17: 100 mg via INTRAVENOUS
  Administered 2024-11-17: 50 mg via INTRAVENOUS

## 2024-11-17 MED ORDER — PROPOFOL 500 MG/50ML IV EMUL
INTRAVENOUS | Status: DC | PRN
  Administered 2024-11-17: 125 ug/kg/min via INTRAVENOUS

## 2024-11-17 MED ORDER — SODIUM CHLORIDE 0.9 % IV SOLN
INTRAVENOUS | Status: DC
  Administered 2024-11-17: 20 mL/h via INTRAVENOUS

## 2024-11-17 NOTE — Transfer of Care (Signed)
 Immediate Anesthesia Transfer of Care Note  Patient: Viraat Vanpatten [facility]  Procedure(s) Performed: COLONOSCOPY POLYPECTOMY, INTESTINE CONTROL OF HEMORRHAGE, GI TRACT, ENDOSCOPIC, BY CLIPPING OR OVERSEWING  Patient Location: PACU  Anesthesia Type:MAC  Level of Consciousness: drowsy, patient cooperative, and responds to stimulation  Airway & Oxygen Therapy: Patient Spontanous Breathing and Patient connected to nasal cannula oxygen  Post-op Assessment: Report given to RN and Post -op Vital signs reviewed and stable  Post vital signs: Reviewed and stable  Last Vitals:  Vitals Value Taken Time  BP 112/51 11/17/24 10:01  Temp 35.3 C 11/17/24 10:01  Pulse 74 11/17/24 10:03  Resp 19 11/17/24 10:03  SpO2 97 % 11/17/24 10:03  Vitals shown include unfiled device data.  Last Pain:  Vitals:   11/17/24 1001  TempSrc: Tympanic  PainSc: 0-No pain         Complications: No notable events documented.

## 2024-11-17 NOTE — Op Note (Signed)
 [facility] Gastroenterology Patient Name: Edward Gutierrez Procedure Date: 11/17/2024 8:58 AM MRN: [id number] Account #: [id number] Date of Birth: [birth year] Admit Type: Outpatient Age: 58 Room: [facility] ENDO ROOM 1 Gender: Male Note Status: Finalized Instrument Name: Colon Scope [id number] Procedure:             Colonoscopy Indications:           Screening for colorectal malignant neoplasm Providers:             Jayson KIDD. Marinda, MD Referring MD:          Mliss FALCON. Pender (Referring MD) Medicines:             See the Anesthesia note for documentation of the                         administered medications Complications:         No immediate complications. Estimated blood loss:                         Minimal. Procedure:             Pre-Anesthesia Assessment:                        - Prior to the procedure, a History and Physical was                         performed, and patient medications and allergies were                         reviewed. The patient's tolerance of previous                         anesthesia was also reviewed. The risks and benefits                         of the procedure and the sedation options and risks                         were discussed with the patient. All questions were                         answered, and informed consent was obtained. Prior                         Anticoagulants: The patient has taken no anticoagulant                         or antiplatelet agents. ASA Grade Assessment: II - A                         patient with mild systemic disease. After reviewing                         the risks and benefits, the patient was deemed in                         satisfactory condition to undergo the procedure.  After obtaining informed consent, the colonoscope was                         passed under direct vision. Throughout the procedure,                         the patient's blood pressure, pulse, and  oxygen                         saturations were monitored continuously. The                         Colonoscope was introduced through the anus and                         advanced to the the cecum, identified by appendiceal                         orifice and ileocecal valve. The colonoscopy was                         performed without difficulty. The patient tolerated                         the procedure well. The quality of the bowel                         preparation was good. Findings:      Multiple medium-mouthed diverticula were found in the sigmoid colon and       descending colon.      A 4 mm polyp was found in the hepatic flexure. The polyp was sessile.       The polyp was removed with a cold biopsy forceps. Resection and       retrieval were complete.      A 15 mm polyp was found in the proximal transverse colon. The polyp was       sessile. The polyp was removed with a hot snare. Resection and retrieval       were complete. To prevent bleeding after the polypectomy, two hemostatic       clips were successfully placed. There was no bleeding at the end of the       procedure.      A 3 mm polyp was found in the proximal descending colon. The polyp was       sessile. The polyp was removed with a cold biopsy forceps. Resection and       retrieval were complete. Impression:            - Diverticulosis in the sigmoid colon and in the                         descending colon.                        - One 4 mm polyp at the hepatic flexure, removed with                         a cold biopsy forceps. Resected and retrieved.                        -  One 15 mm polyp in the proximal transverse colon,                         removed with a hot snare. Resected and retrieved.                         Clips were placed.                        - One 3 mm polyp in the proximal descending colon,                         removed with a cold biopsy forceps. Resected and                          retrieved. Recommendation:        - Repeat colonoscopy in 3 years for surveillance based                         on pathology results. Procedure Code(s):     --- Professional ---                        [id number], Colonoscopy, flexible; with removal of                         tumor(s), polyp(s), or other lesion(s) by snare                         technique                        45380, 59, Colonoscopy, flexible; with biopsy, single                         or multiple CPT copyright 2022 [employer]. All rights reserved. The codes documented in this report are preliminary and upon coder review may  be revised to meet current compliance requirements. Jayson MALVA Endow, MD 11/17/2024 10:02:31 AM Number of Addenda: 0 Note Initiated On: 11/17/2024 8:58 AM Estimated Blood Loss:  Estimated blood loss was minimal.      [facility]

## 2024-11-17 NOTE — H&P (Signed)
 Primary Care Physician:  Gareth Mliss FALCON, FNP Primary Gastroenterologist:  Dr. Marinda  Pre-Procedure History & Physical: HPI:  Edward Gutierrez is a 58 y.o. male is here for an colonoscopy.   Past Medical History:  Diagnosis Date   Diabetes mellitus without complication (HCC)    Diverticulitis 2018   Hyperlipidemia    Hypertension    Joint pain    Sleep apnea    c pap    Past Surgical History:  Procedure Laterality Date   COLONOSCOPY WITH PROPOFOL  N/A 06/17/2018   Procedure: COLONOSCOPY WITH PROPOFOL ;  Surgeon: Dessa Reyes LELON, MD;  Location: [facility] ENDOSCOPY;  Service: Endoscopy;  Laterality: N/A;   HERNIA REPAIR  01/30/2007   Epigastric hernia repaired with small Kugwl patch   HERNIA REPAIR  06/28/14   umbilical hernia    Prior to Admission medications   Medication Sig Start Date End Date Taking? Authorizing Provider  amLODipine  (NORVASC ) 10 MG tablet Take 1 tablet (10 mg total) by mouth daily. 08/06/24  Yes Gareth Mliss FALCON, FNP  benazepril  (LOTENSIN ) 40 MG tablet Take 1 tablet (40 mg total) by mouth daily. 08/06/24  Yes Pender, Julie F, FNP  canagliflozin (INVOKANA) 300 MG TABS tablet Take 300 mg by mouth daily before breakfast.   Yes [provider]  Choline Fenofibrate  (FENOFIBRIC ACID ) 135 MG CPDR Take 1 capsule by mouth daily. 08/06/24  Yes Gareth Mliss FALCON, FNP  Continuous Glucose Sensor (DEXCOM G7 SENSOR) MISC Every 10 days 08/06/24  Yes Pender, Julie F, FNP  glimepiride (AMARYL) 4 MG tablet Take 4 mg by mouth daily with breakfast.   Yes [provider]  Insulin  Pen Needle 32G X 6 MM MISC 1 each by Does not apply route daily at 6 (six) AM. 08/06/24  Yes Pender, Julie F, FNP  Omega-3 1000 MG CAPS Take by mouth daily.   Yes [provider]  sitaGLIPtin-metformin (JANUMET ) 50-1000 MG tablet Take 1 tablet by mouth 2 (two) times daily. 08/06/24  Yes Pender, Julie F, FNP  tirzepatide  (MOUNJARO ) 12.5 MG/0.5ML Pen Inject 12.5 mg into the skin once a week.  08/06/24  Yes Gareth Mliss FALCON, FNP  TOUJEO  SOLOSTAR 300 UNIT/ML Solostar Pen INJECT SUBCUTANEOUSLY 47 UNITS  DAILY 08/27/24  Yes Pender, Julie F, FNP    Allergies as of 08/09/2024 - Review Complete 08/06/2024  Allergen Reaction Noted   Codeine Nausea And Vomiting 07/06/2014    Family History  Problem Relation Age of Onset   Cancer Mother        lung   Cancer Sister        breast   Diabetes Sister     Social History   Socioeconomic History   Marital status: Married    Spouse name: Not on file   Number of children: Not on file   Years of education: Not on file   Highest education level: Bachelor's degree (e.g., BA, AB, BS)  Occupational History   Not on file  Tobacco Use   Smoking status: Former    Current packs/day: 0.00    Types: Cigarettes    Quit date: 12/30/2008    Years since quitting: 15.8   Smokeless tobacco: Never  Substance and Sexual Activity   Alcohol use: Yes    Comment: occasional   Drug use: No   Sexual activity: Not on file  Other Topics Concern   Not on file  Social History Narrative   Not on file   Social Drivers of Corporate Investment Banker  Strain: Low Risk  (08/02/2024)   Overall Financial Resource Strain (CARDIA)    Difficulty of Paying Living Expenses: Not hard at all  Food Insecurity: No Food Insecurity (08/02/2024)   Hunger Vital Sign    Worried About Running Out of Food in the Last Year: Never true    Ran Out of Food in the Last Year: Never true  Transportation Needs: No Transportation Needs (08/02/2024)   PRAPARE - Administrator, Civil Service (Medical): No    Lack of Transportation (Non-Medical): No  Physical Activity: Insufficiently Active (08/02/2024)   Exercise Vital Sign    Days of Exercise per Week: 4 days    Minutes of Exercise per Session: 30 min  Stress: Stress Concern Present (08/02/2024)   [employer] of Occupational Health - Occupational Stress Questionnaire    Feeling of Stress: To some extent  Social  Connections: Socially Isolated (08/02/2024)   Social Connection and Isolation Panel    Frequency of Communication with Friends and Family: Never    Frequency of Social Gatherings with Friends and Family: Never    Attends Religious Services: Never    Database Administrator or Organizations: No    Attends Engineer, Structural: Not on file    Marital Status: Married  Catering Manager Violence: Not on file    Review of Systems: See HPI, otherwise negative ROS  Physical Exam: BP (!) 147/84   Pulse (!) 58   Temp (!) 96.9 F (36.1 C) (Temporal)   Resp 20   Ht 5' 10 (1.778 m)   Wt 131.6 kg   SpO2 98%   BMI 41.64 kg/m  General:   Alert,  pleasant and cooperative in NAD Head:  Normocephalic and atraumatic. Neck:  Supple; no masses or thyromegaly. Lungs:  Clear throughout to auscultation.    Heart:  Regular rate and rhythm. Abdomen:  Soft, nontender and nondistended. Normal bowel sounds, without guarding, and without rebound.   Neurologic:  Alert and  oriented x4;  grossly normal neurologically.  Impression/Plan: Edward Gutierrez is here for an colonoscopy to be performed for screening  Risks, benefits, limitations, and alternatives regarding  colonoscopy have been reviewed with the patient.  Questions have been answered.  All parties agreeable.   Jayson MALVA Endow, MD  11/17/2024, 7:53 AM

## 2024-11-17 NOTE — Discharge Instructions (Signed)
 YOU HAD AN ENDOSCOPIC PROCEDURE TODAY: Refer to the procedure report that was given to you for any specific questions about what was found during the examination.  If the procedure report does not answer your questions, please call your gastroenterologist to clarify.  YOU SHOULD EXPECT: Some feelings of bloating in the abdomen. Passage of more gas than usual.  Walking can help get rid of the air that was put into your GI tract during the procedure and reduce the bloating. If you had a lower endoscopy (such as a colonoscopy or flexible sigmoidoscopy) you may notice spotting of blood in your stool or on the toilet paper.   DIET: Your first meal following the procedure should be a light meal and then it is ok to progress to your normal diet.  A half-sandwich or bowl of soup is an example of a good first meal.  Heavy or fried foods are harder to digest and may make you feel nasueas or bloated.  Drink plenty of fluids but you should avoid alcoholic beverages for 24 hours.  ACTIVITY: Your care partner should take you home directly after the procedure.  You should plan to take it easy, moving slowly for the rest of the day.  You can resume normal activity the day after the procedure however you should NOT DRIVE, make legal decisions or use heavy machinery for 24 hours (because of the sedation medicines used during the test).    SYMPTOMS TO REPORT IMMEDIATELY  A gastroenterologist can be reached at any hour.  Please call your doctor's office for any of the following symptoms:  Following lower endoscopy (colonoscopy, flexible sigmoidoscopy)  Excessive amounts of blood in the stool  Significant tenderness, worsening of abdominal pains  Swelling of the abdomen that is new, acute  Fever of 100 or higher Following upper endoscopy (EGD, EUS, ERCP)  Vomiting of blood or coffee ground material  New, significant abdominal pain  New, significant chest pain or pain under the shoulder blades  Painful or  persistently difficult swallowing  New shortness of breath  Black, tarry-looking stools  FOLLOW UP: If any biopsies were taken you will be contacted by phone or by letter within the next 1-3 weeks.  Call your gastroenterologist if you have not heard about the biopsies in 3 weeks.   Please also call your gastroenterologist's office with any specific questions about appointments or follow up tests.

## 2024-11-17 NOTE — Anesthesia Postprocedure Evaluation (Signed)
 Anesthesia Post Note  Patient: Edward Gutierrez [facility]  Procedure(s) Performed: COLONOSCOPY POLYPECTOMY, INTESTINE CONTROL OF HEMORRHAGE, GI TRACT, ENDOSCOPIC, BY CLIPPING OR OVERSEWING  Patient location during evaluation: Endoscopy Anesthesia Type: General Level of consciousness: awake and alert Pain management: pain level controlled Vital Signs Assessment: post-procedure vital signs reviewed and stable Respiratory status: spontaneous breathing, nonlabored ventilation and respiratory function stable Cardiovascular status: blood pressure returned to baseline and stable Postop Assessment: no apparent nausea or vomiting Anesthetic complications: no   No notable events documented.   Last Vitals:  Vitals:   11/17/24 1010 11/17/24 1015  BP: 126/72   Pulse: 63 61  Resp: 13 15  Temp:    SpO2: 98% 99%    Last Pain:  Vitals:   11/17/24 1010  TempSrc:   PainSc: 0-No pain                 Fairy POUR Aren Pryde

## 2024-11-17 NOTE — Anesthesia Preprocedure Evaluation (Signed)
 Anesthesia Evaluation  Patient identified by MRN, date of birth, ID band Patient awake    Reviewed: Allergy & Precautions, NPO status , Patient's Chart, lab work & pertinent test results  History of Anesthesia Complications Negative for: history of anesthetic complications  Airway Mallampati: III  TM Distance: <3 FB Neck ROM: full    Dental  (+) Chipped   Pulmonary neg shortness of breath, sleep apnea , former smoker   Pulmonary exam normal        Cardiovascular Exercise Tolerance: Good hypertension, Normal cardiovascular exam     Neuro/Psych  Neuromuscular disease  negative psych ROS   GI/Hepatic negative GI ROS, Neg liver ROS,neg GERD  ,,  Endo/Other  diabetes, Type 2    Renal/GU negative Renal ROS  negative genitourinary   Musculoskeletal   Abdominal   Peds  Hematology negative hematology ROS (+)   Anesthesia Other Findings Past Medical History: No date: Diabetes mellitus without complication (HCC) 2018: Diverticulitis No date: Hyperlipidemia No date: Hypertension No date: Joint pain No date: Sleep apnea     Comment:  c pap  Past Surgical History: 06/17/2018: COLONOSCOPY WITH PROPOFOL ; N/A     Comment:  Procedure: COLONOSCOPY WITH PROPOFOL ;  Surgeon: Dessa Reyes ORN, MD;  Location: [facility] ENDOSCOPY;  Service:               Endoscopy;  Laterality: N/A; 01/30/2007: HERNIA REPAIR     Comment:  Epigastric hernia repaired with small Kugwl patch 06/28/14: HERNIA REPAIR     Comment:  umbilical hernia  BMI    Body Mass Index: 41.64 kg/m      Reproductive/Obstetrics negative OB ROS                              Anesthesia Physical Anesthesia Plan  ASA: 3  Anesthesia Plan: General   Post-op Pain Management:    Induction: Intravenous  PONV Risk Score and Plan: Propofol  infusion and TIVA  Airway Management Planned: Natural Airway and Nasal  Cannula  Additional Equipment:   Intra-op Plan:   Post-operative Plan:   Informed Consent: I have reviewed the patients History and Physical, chart, labs and discussed the procedure including the risks, benefits and alternatives for the proposed anesthesia with the patient or authorized representative who has indicated his/her understanding and acceptance.     Dental Advisory Given  Plan Discussed with: Anesthesiologist, CRNA and Surgeon  Anesthesia Plan Comments: (Patient consented for risks of anesthesia including but not limited to:  - adverse reactions to medications - risk of airway placement if required - damage to eyes, teeth, lips or other oral mucosa - nerve damage due to positioning  - sore throat or hoarseness - Damage to heart, brain, nerves, lungs, other parts of body or loss of life  Patient voiced understanding and assent.)        Anesthesia Quick Evaluation

## 2024-11-18 LAB — SURGICAL PATHOLOGY

## 2024-12-09 ENCOUNTER — Ambulatory Visit: Admitting: Nurse Practitioner

## 2024-12-14 ENCOUNTER — Ambulatory Visit: Admitting: Nurse Practitioner

## 2024-12-14 ENCOUNTER — Encounter: Payer: Self-pay | Admitting: Nurse Practitioner

## 2024-12-14 ENCOUNTER — Other Ambulatory Visit: Payer: Self-pay

## 2024-12-14 VITALS — BP 138/76 | HR 95 | Temp 97.9°F | Resp 18 | Ht 70.0 in | Wt 297.4 lb

## 2024-12-14 DIAGNOSIS — J069 Acute upper respiratory infection, unspecified: Secondary | ICD-10-CM

## 2024-12-14 DIAGNOSIS — I10 Essential (primary) hypertension: Secondary | ICD-10-CM

## 2024-12-14 DIAGNOSIS — Z23 Encounter for immunization: Secondary | ICD-10-CM

## 2024-12-14 DIAGNOSIS — E782 Mixed hyperlipidemia: Secondary | ICD-10-CM

## 2024-12-14 DIAGNOSIS — G4733 Obstructive sleep apnea (adult) (pediatric): Secondary | ICD-10-CM

## 2024-12-14 DIAGNOSIS — E1165 Type 2 diabetes mellitus with hyperglycemia: Secondary | ICD-10-CM

## 2024-12-14 DIAGNOSIS — Z794 Long term (current) use of insulin: Secondary | ICD-10-CM | POA: Diagnosis not present

## 2024-12-14 LAB — POCT GLYCOSYLATED HEMOGLOBIN (HGB A1C): Hemoglobin A1C: 7.2 % — AB (ref 4.0–5.6)

## 2024-12-14 MED ORDER — AZITHROMYCIN 250 MG PO TABS: ORAL_TABLET | ORAL | 0 refills | Status: AC

## 2024-12-14 NOTE — Progress Notes (Signed)
 BP 138/76 (Cuff Size: Large)   Pulse 95   Temp 97.9 F (36.6 C) (Oral)   Resp 18   Ht 5' 10 (1.778 m)   Wt 297 lb 6.4 oz (134.9 kg)   SpO2 98%   BMI 42.67 kg/m    Subjective:    Patient ID: Edward Gutierrez Breed, male    DOB: April 07, 1966, 58 y.o.   MRN: [id number]  HPI: LON KLIPPEL is a 58 y.o. male  Chief Complaint  Patient presents with   Medical Management of Chronic Issues    4 month recheck   Discussed the use of AI scribe software for clinical note transcription with the patient, who gave verbal consent to proceed.  History of Present Illness Rockney Grenz is a 58 year old male with hypertension, sleep apnea, type two diabetes, hyperlipidemia, and obesity who presents for a routine four month follow-up.  Recent upper respiratory symptoms - Fever up to 101.51F on Saturday night and Sunday morning, now resolved - Productive cough with green sputum - Sinus congestion with transient hearing impairment, now resolved - Current symptoms include persistent congestion, especially in the mornings - No current fever - Used over-the-counter medications: Relistor during the day, NyQuil at night - Overall improvement in symptoms  Type 2 diabetes mellitus - On Invokana 300 mg daily, Amaryl 4 mg daily, Janumet 50/1000 mg twice daily, Mounjaro 12.5 mg weekly, Toujeo 47 units daily - Last A1c was 7.5%, current A1c is 7.2% - Blood glucose generally well controlled but not optimal  Hypertension - On amlodipine 10 mg daily and benazepril 40 mg daily - Blood pressure today is 138/76 BP Readings from Last 3 Encounters:  12/14/24 138/76  11/17/24 126/72  08/06/24 134/82     Hyperlipidemia - On fenofibrate 135 mg daily - Last LDL was 92 Lipid Panel     Component Value Date/Time   CHOL 152 08/06/2024 0809   TRIG 133 08/06/2024 0809   HDL 37 (L) 08/06/2024 0809   CHOLHDL 4.1 08/06/2024 0809   LDLCALC 92 08/06/2024 0809     Obesity - Current weight is  297 pounds - BMI is 42.67 Wt Readings from Last 3 Encounters:  12/14/24 297 lb 6.4 oz (134.9 kg)  11/17/24 290 lb 3.2 oz (131.6 kg)  08/06/24 (!) 300 lb 3.2 oz (136.2 kg)    Obstructive sleep apnea - Uses CPAP machine nightly  Recent dental infection - Pain under a crown treated with a 7-day course of antibiotics, now completed         12 /16/2025    7:56 AM 04/05/2024    7:49 AM 12/05/2023    2:34 PM  Depression screen PHQ 2/9  Decreased Interest 0 0 2  Down, Depressed, Hopeless 0 0 0  PHQ - 2 Score 0 0 2  Altered sleeping 0  0  Tired, decreased energy 0  2  Change in appetite 0  0  Feeling bad or failure about yourself  0  0  Trouble concentrating 0  0  Moving slowly or fidgety/restless 0  0  Suicidal thoughts 0  0  PHQ-9 Score 0  4   Difficult doing work/chores Not difficult at all  Not difficult at all     Data saved with a previous flowsheet row definition    Relevant past medical, surgical, family and social history reviewed and updated as indicated. Interim medical history since our last visit reviewed. Allergies and medications reviewed and updated.  Review of Systems  Ten systems reviewed and is negative except as mentioned in HPI      Objective:      BP 138/76 (Cuff Size: Large)   Pulse 95   Temp 97.9 F (36.6 C) (Oral)   Resp 18   Ht 5' 10 (1.778 m)   Wt 297 lb 6.4 oz (134.9 kg)   SpO2 98%   BMI 42.67 kg/m    Wt Readings from Last 3 Encounters:  12/14/24 297 lb 6.4 oz (134.9 kg)  11/17/24 290 lb 3.2 oz (131.6 kg)  08/06/24 (!) 300 lb 3.2 oz (136.2 kg)    Physical Exam VITALS: BP- 138/76 MEASUREMENTS: Weight- 297, BMI- 42.67. GENERAL: Alert, cooperative, well developed, no acute distress. HEENT: Normocephalic, normal oropharynx, moist mucous membranes. CHEST: Lungs congested. CARDIOVASCULAR: Normal heart rate and rhythm, S1 and S2 normal without murmurs. ABDOMEN: Soft, non-tender, non-distended, without organomegaly, normal bowel  sounds. EXTREMITIES: No cyanosis or edema. NEUROLOGICAL: Cranial nerves grossly intact, moves all extremities without gross motor or sensory deficit.  Diabetic Foot Exam - Simple   Simple Foot Form Diabetic Foot exam was performed with the following findings: Yes 12/14/2024  7:52 AM  Visual Inspection No deformities, no ulcerations, no other skin breakdown bilaterally: Yes Sensation Testing Intact to touch and monofilament testing bilaterally: Yes Pulse Check Posterior Tibialis and Dorsalis pulse intact bilaterally: Yes Comments     Results for orders placed or performed in visit on 12/14/24  POCT HgB A1C   Collection Time: 12/14/24  7:44 AM  Result Value Ref Range   Hemoglobin A1C 7.2 (A) 4.0 - 5.6 %   HbA1c POC (<> result, manual entry)     HbA1c, POC (prediabetic range)     HbA1c, POC (controlled diabetic range)            Assessment & Plan:   Problem List Items Addressed This Visit       Cardiovascular and Mediastinum   Hypertension     Respiratory   OSA (obstructive sleep apnea)     Endocrine   Type 2 diabetes mellitus (HCC) - Primary   Relevant Orders   POCT HgB A1C (Completed)   Microalbumin / creatinine urine ratio   HM Diabetes Foot Exam (Completed)     Other   Hyperlipemia   Morbid obesity (HCC)   Other Visit Diagnoses       Need for Tdap vaccination       Relevant Orders   Tdap vaccine greater than or equal to 7yo IM (Completed)     Viral upper respiratory tract infection       Relevant Medications   azithromycin  (ZITHROMAX ) 250 MG tablet        Assessment and Plan Assessment & Plan Acute upper respiratory infection Recent onset with fever up to 101.19F, now resolved. Persistent congestion and productive cough with pea-green sputum. Symptoms improving with over-the-counter medications. - Continue over-the-counter medications such as NyQuil and Relistor. - Recommended plain Mucinex to help with mucus clearance. - Advised use of Flonase  and an allergy pill to dry secretions. - Encouraged adequate hydration to prevent dehydration. -sent in azithromycin    Type 2 diabetes mellitus Recent improvement in glycemic control. Hemoglobin A1c decreased from 7.5% to 7.2%. - Performed point-of-care A1c test. - Conducted microalbumin urine test, DM foot exam  Primary hypertension Blood pressure well-controlled at 138/76 mmHg. - Continue current antihypertensive medications: amlodipine  10 mg daily and benazepril  40 mg daily.  Obstructive sleep apnea Managed with CPAP therapy. He reports consistent nightly  use. - Continue CPAP therapy.  Mixed hyperlipidemia Recent LDL level was 92 mg/dL.  Morbid obesity BMI of 42.67. Weight is 297 pounds. - Encourage continuation of lifestyle modifications, including dietary management and regular exercise. -continue to increase physical activity, getting at least 150 min of physical activity a week.  Work on including runner, broadcasting/film/video 2 days a week.  - continue eating at a calorie deficit 2000-2200 cal a day, eating a well balanced diet with whole foods, avoiding processed foods.   Patient is motivated to continue working on lifestyle modification.          Follow up plan: Return in about 4 months (around 04/14/2025) for follow up.

## 2024-12-16 ENCOUNTER — Ambulatory Visit: Payer: Self-pay | Admitting: Nurse Practitioner

## 2024-12-16 LAB — MICROALBUMIN / CREATININE URINE RATIO
Creatinine, Urine: 248 mg/dL (ref 20–320)
Microalb Creat Ratio: 110 mg/g{creat} — ABNORMAL HIGH (ref <30)
Microalb, Ur: 27.3 mg/dL
